# Patient Record
Sex: Female | Born: 2000 | Race: White | Hispanic: No | Marital: Single | Smoking: Former smoker
Health system: Southern US, Community
[De-identification: ages and names within clinical notes are randomized; demographics above are authoritative.]

## PROBLEM LIST (undated history)

## (undated) DIAGNOSIS — F329 Major depressive disorder, single episode, unspecified: Secondary | ICD-10-CM

## (undated) DIAGNOSIS — G43909 Migraine, unspecified, not intractable, without status migrainosus: Secondary | ICD-10-CM

## (undated) HISTORY — DX: Migraine, unspecified, not intractable, without status migrainosus: G43.909

## (undated) HISTORY — DX: Major depressive disorder, single episode, unspecified: F32.9

---
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
## 2018-01-17 ENCOUNTER — Encounter: Payer: Self-pay | Admitting: Family Medicine

## 2018-01-17 ENCOUNTER — Ambulatory Visit: Payer: Managed Care, Other (non HMO) | Admitting: Family Medicine

## 2018-01-17 VITALS — BP 104/64 | HR 92 | Temp 98.3°F | Resp 16 | Ht 64.25 in | Wt 127.4 lb

## 2018-01-17 DIAGNOSIS — F419 Anxiety disorder, unspecified: Secondary | ICD-10-CM

## 2018-01-17 DIAGNOSIS — M41125 Adolescent idiopathic scoliosis, thoracolumbar region: Secondary | ICD-10-CM | POA: Diagnosis not present

## 2018-01-17 DIAGNOSIS — L309 Dermatitis, unspecified: Secondary | ICD-10-CM | POA: Diagnosis not present

## 2018-01-17 DIAGNOSIS — R110 Nausea: Secondary | ICD-10-CM

## 2018-01-17 DIAGNOSIS — H9203 Otalgia, bilateral: Secondary | ICD-10-CM

## 2018-01-17 DIAGNOSIS — F329 Major depressive disorder, single episode, unspecified: Secondary | ICD-10-CM

## 2018-01-17 DIAGNOSIS — F32A Depression, unspecified: Secondary | ICD-10-CM

## 2018-01-17 DIAGNOSIS — Z6981 Encounter for mental health services for victim of other abuse: Secondary | ICD-10-CM | POA: Insufficient documentation

## 2018-01-17 MED ORDER — SERTRALINE HCL 50 MG PO TABS: 50.0000 mg | ORAL_TABLET | Freq: Every day | ORAL | 3 refills | Status: DC

## 2018-01-17 MED ORDER — ONDANSETRON HCL 4 MG PO TABS: 4.0000 mg | ORAL_TABLET | Freq: Three times a day (TID) | ORAL | 0 refills | Status: DC | PRN

## 2018-01-17 MED ORDER — TRIAMCINOLONE ACETONIDE 0.1 % EX CREA: 1.0000 "application " | TOPICAL_CREAM | Freq: Two times a day (BID) | CUTANEOUS | 0 refills | Status: DC

## 2018-01-17 MED ORDER — LORATADINE 10 MG PO TABS: 10.0000 mg | ORAL_TABLET | Freq: Every day | ORAL | 11 refills | Status: DC

## 2018-01-17 MED ORDER — HYDROXYZINE HCL 10 MG PO TABS: 10.0000 mg | ORAL_TABLET | Freq: Three times a day (TID) | ORAL | 3 refills | Status: DC | PRN

## 2018-01-17 NOTE — Progress Notes (Addendum)
Subjective:    Patient ID: Robyn Gonzalez, female    DOB: 2000-08-30, 17 y.o.   MRN: [id number]  HPI  Presents to clinic to establish care as a new patient.   She has a long-standing history of anxiety and depression.  She was being treated as a preteen first with  Prozac, then was switched to Zoloft.  Patient states she became upset that she needed a medication to make her feel better so she stopped taking it.  However, she realizes now that she did feel better when on Zoloft and would like to trial this medication again.  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  Denies suicidal ideation or any thoughts of hurting others.  Also reports eczema flareups.  States she will get patchy rashes on legs and sometimes on the abdomen area.  States she has tried many over-the-counter lotions without much help and soothing skin.  Also has been treated off and on for her scoliosis with physical therapy for many years.  She is interested in trying physical therapy again and also seeing a scoliosis specialist.  States her friend is seeing a physical therapist and really likes them, would like to also see this PT.  She will call and let me know the name of person so we can get her set up.  Also today is complaining of bilateral ear pain and nausea.  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  She has not been vomiting or having diarrhea.  Denies any possible chance of pregnancy.   Patient Active Problem List   Diagnosis Date Noted  . Eczema 01/17/2018  . Anxiety and depression 01/17/2018  . Adolescent idiopathic scoliosis of thoracolumbar region 01/17/2018  . Depression 06/13/2009   Social History   Tobacco Use  . Smoking status: Current Every Day Smoker    Types: E-cigarettes    Start date: 01/18/2015  . Smokeless  tobacco: Never Used  Substance Use Topics  . Alcohol use: Never    Frequency: Never   Review of Systems  Constitutional: Negative for chills, diaphoresis, fatigue and fever.  HENT: Positive for ear pain. Negative for congestion, dental problem, ear discharge, postnasal drip, sinus pressure, sinus pain, sore throat, tinnitus and trouble swallowing.   Eyes: Negative for pain, discharge, redness and itching.  Respiratory: Negative for cough, shortness of breath and wheezing.   Cardiovascular: Negative for chest pain, palpitations and leg swelling.  Gastrointestinal: Positive for nausea. Negative for abdominal distention, abdominal pain, diarrhea and vomiting.  Genitourinary: Negative for difficulty urinating, dysuria and menstrual problem.  Musculoskeletal: Negative for arthralgias and myalgias.  Skin: Positive for rash.       Scattered eczema rash on legs  Neurological: Negative for dizziness, light-headedness and headaches.  Psychiatric/Behavioral: Positive for sleep disturbance. Negative for suicidal ideas. The patient is nervous/anxious.        Increased anxiety, sometimes this causes her issues with being able to fall asleep.       Objective:   Physical Exam  Constitutional: She is oriented to person, place, and time. She appears well-developed and well-nourished. No distress.  HENT:  Head: Normocephalic and atraumatic.  Right Ear: External ear normal.  Left Ear: External ear normal.  Nose: Nose normal.  Mouth/Throat: Oropharynx is clear and moist.  Some middle ear  fluid, TMs not red  Eyes: Pupils are equal, round, and reactive to light. Conjunctivae and EOM are normal. Right eye exhibits no discharge. Left eye exhibits no discharge. No scleral icterus.  Neck: Normal range of motion. Neck supple. No tracheal deviation present.  Cardiovascular: Normal rate, regular rhythm and normal heart sounds.  Pulmonary/Chest: Effort normal and breath sounds normal. No respiratory distress. She  has no wheezes. She has no rales.  Abdominal: Soft. Bowel sounds are normal. She exhibits no distension.  Lymphadenopathy:    She has no cervical adenopathy.  Neurological: She is alert and oriented to person, place, and time. No cranial nerve deficit. Coordination normal.  Gait normal  Skin: Skin is warm and dry. No pallor.  Small faint red rash area on both legs, skin is not dry or flaking.   Psychiatric: She has a normal mood and affect. Her behavior is normal. Judgment and thought content normal.  Nursing note and vitals reviewed.  Vitals:   01/17/18 1103 01/17/18 1140  BP: 104/64   Pulse: (!) 113 92  Resp: 16   Temp: 98.3 F (36.8 C)   SpO2: 97%        Assessment & Plan:   A total of 30  minutes were spent face-to-face with the patient during this encounter and over half of that time was spent on counseling and coordination of care. The patient was counseled on strategies to manage anxiety and depression, medications.    Anxiety and depression -patient will begin Zoloft 50 mg once daily.  She will also use hydroxyzine 10 mg as needed for breakthrough anxiety.  Discussed strategies to help keep a positive outlook.  Advised to look at her medication as a tool that helps her rather than a negative thing.  Offered counseling, but patient would prefer not to do this.  Patient has good support from her family and is looking forward to starting school next month.  Eczema-patient will use topical steroid cream as needed for rash.  If rash symptoms worsen, we discussed option of referral to dermatology.  Ear pain- I feel this is allergy related.  Patient will start daily Claritin to see if this helps.  Scoliosis -  patient will let me know name of physical therapist she would like to see & we will do referral. Also will place referral to orthopedic specialist, patient aware that surgery most likely will be needed on her spine at some point.   Nausea- symptom is very mild at this time.  Zofran to use as needed given.  Advised to increase water intake and be sure to do good handwashing.  She will follow up in 3-4 weeks to see how she is feeling on new medication. She has signed medical release so we can get records from the pediatrician.

## 2018-01-17 NOTE — Patient Instructions (Signed)
Great to meet you!  Keep a positive attitude, you have a lot of great things happening in your life

## 2018-01-18 ENCOUNTER — Telehealth: Payer: Self-pay | Admitting: Family Medicine

## 2018-01-18 NOTE — Telephone Encounter (Signed)
RO[facility] from [facility] on 01/18/2018.

## 2018-01-18 NOTE — Addendum Note (Signed)
Addended by: Leanora CoverGUSE, Gustavus Haskin on: 01/18/2018 07:48 AM   Modules accepted: Orders

## 2018-01-24 ENCOUNTER — Telehealth: Payer: Self-pay | Admitting: Family Medicine

## 2018-01-24 NOTE — Telephone Encounter (Signed)
ROI received from [facility] on 01/24/18.

## 2018-01-31 ENCOUNTER — Ambulatory Visit: Payer: Managed Care, Other (non HMO) | Admitting: Family Medicine

## 2018-01-31 ENCOUNTER — Encounter

## 2018-02-01 ENCOUNTER — Ambulatory Visit: Payer: Managed Care, Other (non HMO) | Admitting: Family Medicine

## 2018-02-01 DIAGNOSIS — Z0289 Encounter for other administrative examinations: Secondary | ICD-10-CM

## 2018-02-07 ENCOUNTER — Other Ambulatory Visit: Payer: Self-pay

## 2018-02-07 ENCOUNTER — Encounter: Payer: Self-pay | Admitting: Family Medicine

## 2018-02-07 ENCOUNTER — Ambulatory Visit: Payer: Managed Care, Other (non HMO) | Admitting: Family Medicine

## 2018-02-07 VITALS — BP 96/68 | HR 94 | Temp 98.2°F | Wt 125.1 lb

## 2018-02-07 DIAGNOSIS — G4700 Insomnia, unspecified: Secondary | ICD-10-CM | POA: Diagnosis not present

## 2018-02-07 DIAGNOSIS — F419 Anxiety disorder, unspecified: Secondary | ICD-10-CM | POA: Diagnosis not present

## 2018-02-07 DIAGNOSIS — F32A Depression, unspecified: Secondary | ICD-10-CM

## 2018-02-07 DIAGNOSIS — R110 Nausea: Secondary | ICD-10-CM

## 2018-02-07 DIAGNOSIS — F329 Major depressive disorder, single episode, unspecified: Secondary | ICD-10-CM | POA: Diagnosis not present

## 2018-02-07 MED ORDER — ONDANSETRON HCL 4 MG PO TABS: 4.0000 mg | ORAL_TABLET | Freq: Three times a day (TID) | ORAL | 0 refills | Status: DC | PRN

## 2018-02-07 NOTE — Progress Notes (Signed)
   Subjective:    Patient ID: Robyn Gonzalez, female    DOB: [birth year], 17 y.o.   MRN: [id number]  HPI   Patient presents to clinic for follow-up on anxiety and depression after starting Zoloft approximately 3 weeks ago.  States her mood feels improved on this medication, does not feel as nervous.  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  Has not needed to use any hydroxyzine for breakthrough anxiety.  Notes she has trouble sleeping at night, has not been able to shut her mind down.  States insomnia has been happening off and on for many years.  Tries to read and wind down before bed but often will end laying in the dark for a while trying to fall asleep.  States she has tried melatonin in the past but this gave her nightmares.  States she usually will take a nap in the afternoon if she feels tired.  Patient Active Problem List   Diagnosis Date Noted  . Eczema 01/17/2018  . Anxiety and depression 01/17/2018  . Adolescent idiopathic scoliosis of thoracolumbar region 01/17/2018  . Depression 06/13/2009   Social History   Tobacco Use  . Smoking status: Current Every Day Smoker    Types: E-cigarettes    Start date: 01/18/2015  . Smokeless tobacco: Never Used  Substance Use Topics  . Alcohol use: Never    Frequency: Never   Review of Systems  Constitutional: Negative for chills, fatigue and fever.  HENT: Negative for congestion, ear pain, sinus pain and sore throat.   Eyes: Negative.   Respiratory: Negative for cough, shortness of breath and wheezing.   Cardiovascular: Negative for chest pain, palpitations and leg swelling.  Gastrointestinal: Negative for abdominal pain, diarrhea, nausea and vomiting.  Genitourinary: Negative for dysuria, frequency and urgency.  Musculoskeletal: Negative for arthralgias and myalgias.  Skin: Negative for color change, pallor and rash.  Neurological: Negative for syncope, light-headedness and headaches.    Psychiatric/Behavioral: The patient is not nervous/anxious.  +trouble sleeping     Objective:   Physical Exam  Constitutional: She is oriented to person, place, and time. She appears well-developed and well-nourished. No distress.  HENT:  Head: Normocephalic and atraumatic.  Eyes: No scleral icterus.  Cardiovascular: Normal rate and regular rhythm.  Pulmonary/Chest: Effort normal and breath sounds normal. No respiratory distress.  Neurological: She is alert and oriented to person, place, and time.  Gait normal  Skin: Skin is warm and dry. No pallor.  Psychiatric: She has a normal mood and affect. Her behavior is normal. Thought content normal.  No SI  Vitals reviewed.   Vitals:   02/07/18 0803  BP: 96/68  Pulse: 94  Temp: 98.2 F (36.8 C)  SpO2: 96%      Assessment & Plan:    Anxiety and depression - patient has been doing well so far on Zoloft 50 mg, she will continue this dose.   Insomnia - good sleep hygiene discussed, patient given handout about insomnia and ways to treat this at home.  Also advised she can try taking the hydroxyzine to see if this can help her mind calm down at night.   Follow-up in 3 months for recheck.  He may return to clinic sooner as needed if any issues arise.

## 2018-02-07 NOTE — Patient Instructions (Signed)
Great to see you again!  Insomnia Insomnia is a sleep disorder that makes it difficult to fall asleep or to stay asleep. Insomnia can cause tiredness (fatigue), low energy, difficulty concentrating, mood swings, and poor performance at work or school. There are three different ways to classify insomnia:  Difficulty falling asleep.  Difficulty staying asleep.  Waking up too early in the morning.  Any type of insomnia can be long-term (chronic) or short-term (acute). Both are common. Short-term insomnia usually lasts for three months or less. Chronic insomnia occurs at least three times a week for longer than three months. What are the causes? Insomnia may be caused by another condition, situation, or substance, such as:  Anxiety.  Certain medicines.  Gastroesophageal reflux disease (GERD) or other gastrointestinal conditions.  Asthma or other breathing conditions.  Restless legs syndrome, sleep apnea, or other sleep disorders.  Chronic pain.  Menopause. This may include hot flashes.  Stroke.  Abuse of alcohol, tobacco, or illegal drugs.  Depression.  Caffeine.  Neurological disorders, such as Alzheimer disease.  An overactive thyroid (hyperthyroidism).  The cause of insomnia may not be known. What increases the risk? Risk factors for insomnia include:  Gender. Women are more commonly affected than men.  Age. Insomnia is more common as you get older.  Stress. This may involve your professional or personal life.  Income. Insomnia is more common in people with lower income.  Lack of exercise.  Irregular work schedule or night shifts.  Traveling between different time zones.  What are the signs or symptoms? If you have insomnia, trouble falling asleep or trouble staying asleep is the main symptom. This may lead to other symptoms, such as:  Feeling fatigued.  Feeling nervous about going to sleep.  Not feeling rested in the morning.  Having trouble  concentrating.  Feeling irritable, anxious, or depressed.  How is this treated? Treatment for insomnia depends on the cause. If your insomnia is caused by an underlying condition, treatment will focus on addressing the condition. Treatment may also include:  Medicines to help you sleep.  Counseling or therapy.  Lifestyle adjustments.  Follow these instructions at home:  Take medicines only as directed by your health care provider.  Keep regular sleeping and waking hours. Avoid naps.  Keep a sleep diary to help you and your health care provider figure out what could be causing your insomnia. Include: ? When you sleep. ? When you wake up during the night. ? How well you sleep. ? How rested you feel the next day. ? Any side effects of medicines you are taking. ? What you eat and drink.  Make your bedroom a comfortable place where it is easy to fall asleep: ? Put up shades or special blackout curtains to block light from outside. ? Use a white noise machine to block noise. ? Keep the temperature cool.  Exercise regularly as directed by your health care provider. Avoid exercising right before bedtime.  Use relaxation techniques to manage stress. Ask your health care provider to suggest some techniques that may work well for you. These may include: ? Breathing exercises. ? Routines to release muscle tension. ? Visualizing peaceful scenes.  Cut back on alcohol, caffeinated beverages, and cigarettes, especially close to bedtime. These can disrupt your sleep.  Do not overeat or eat spicy foods right before bedtime. This can lead to digestive discomfort that can make it hard for you to sleep.  Limit screen use before bedtime. This includes: ? Watching TV. ?  Using your smartphone, tablet, and computer.  Stick to a routine. This can help you fall asleep faster. Try to do a quiet activity, brush your teeth, and go to bed at the same time each night.  Get out of bed if you are  still awake after 15 minutes of trying to sleep. Keep the lights down, but try reading or doing a quiet activity. When you feel sleepy, go back to bed.  Make sure that you drive carefully. Avoid driving if you feel very sleepy.  Keep all follow-up appointments as directed by your health care provider. This is important. Contact a health care provider if:  You are tired throughout the day or have trouble in your daily routine due to sleepiness.  You continue to have sleep problems or your sleep problems get worse. Get help right away if:  You have serious thoughts about hurting yourself or someone else. This information is not intended to replace advice given to you by your health care provider. Make sure you discuss any questions you have with your health care provider. Document Released: 05/27/2000 Document Revised: 10/30/2015 Document Reviewed: 02/28/2014 Elsevier Interactive Patient Education  [employer].

## 2018-02-17 IMAGING — CR DG SCOLIOSIS EVAL COMPLETE SPINE 1V
2 series · 2 of 2 positions shown · non-contrast
Comparison: 05/13/2016 and 03/23/2015 spinal series.

CLINICAL DATA: 15-year-old female with 3 years of lumbar back pain.
No known injury. Scoliosis. Initial encounter.

EXAM:
DG SCOLIOSIS EVAL COMPLETE SPINE 1V

[tl-spine ap (1 of 2)]
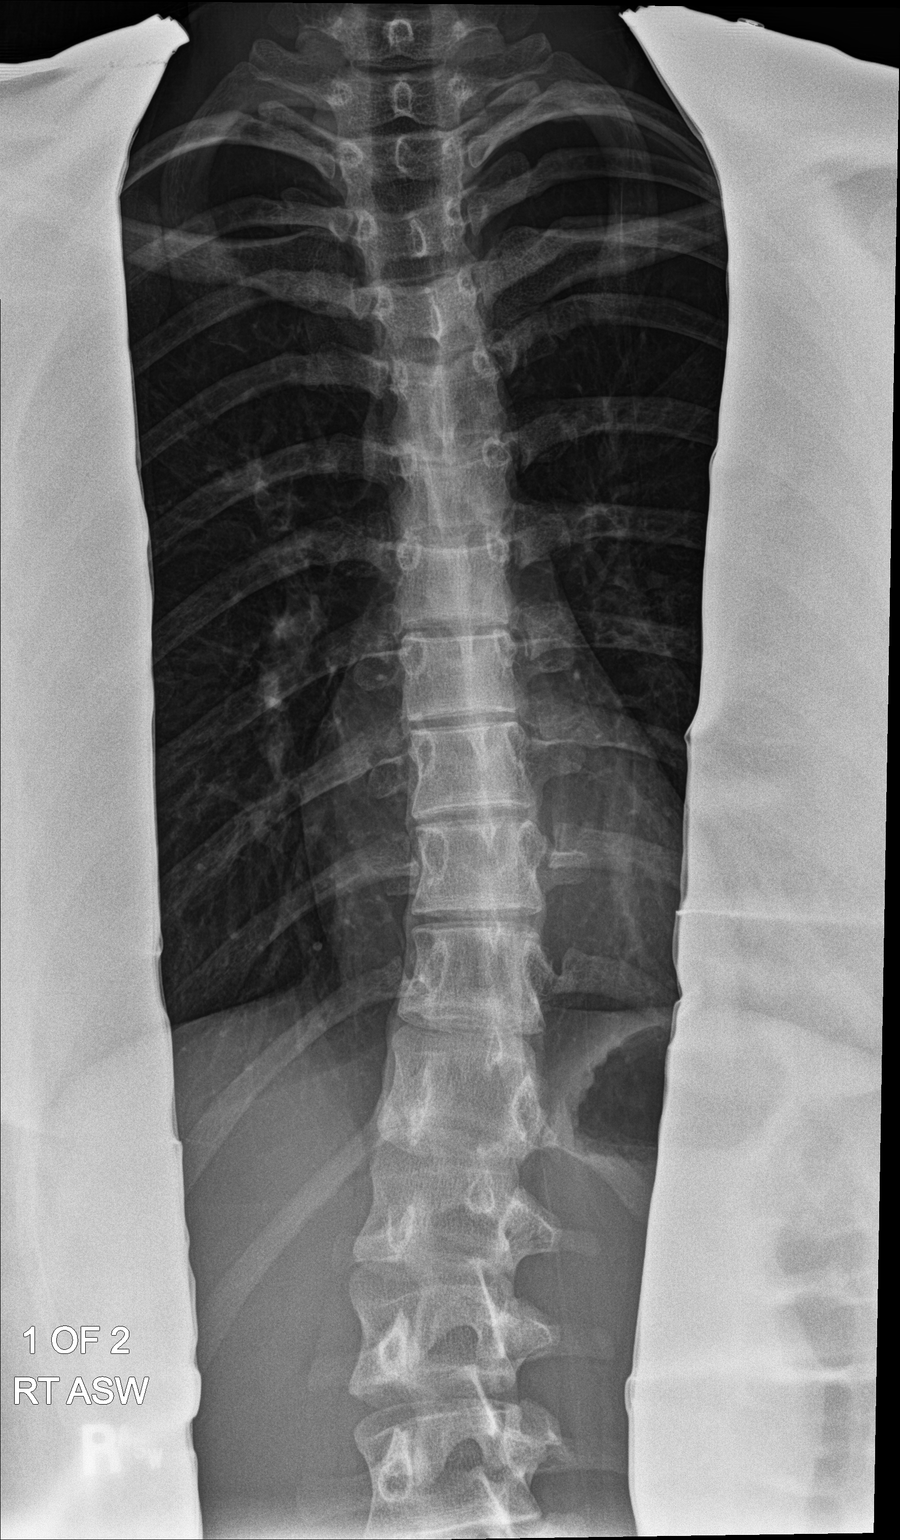

[tl-spine ap (2 of 2)]
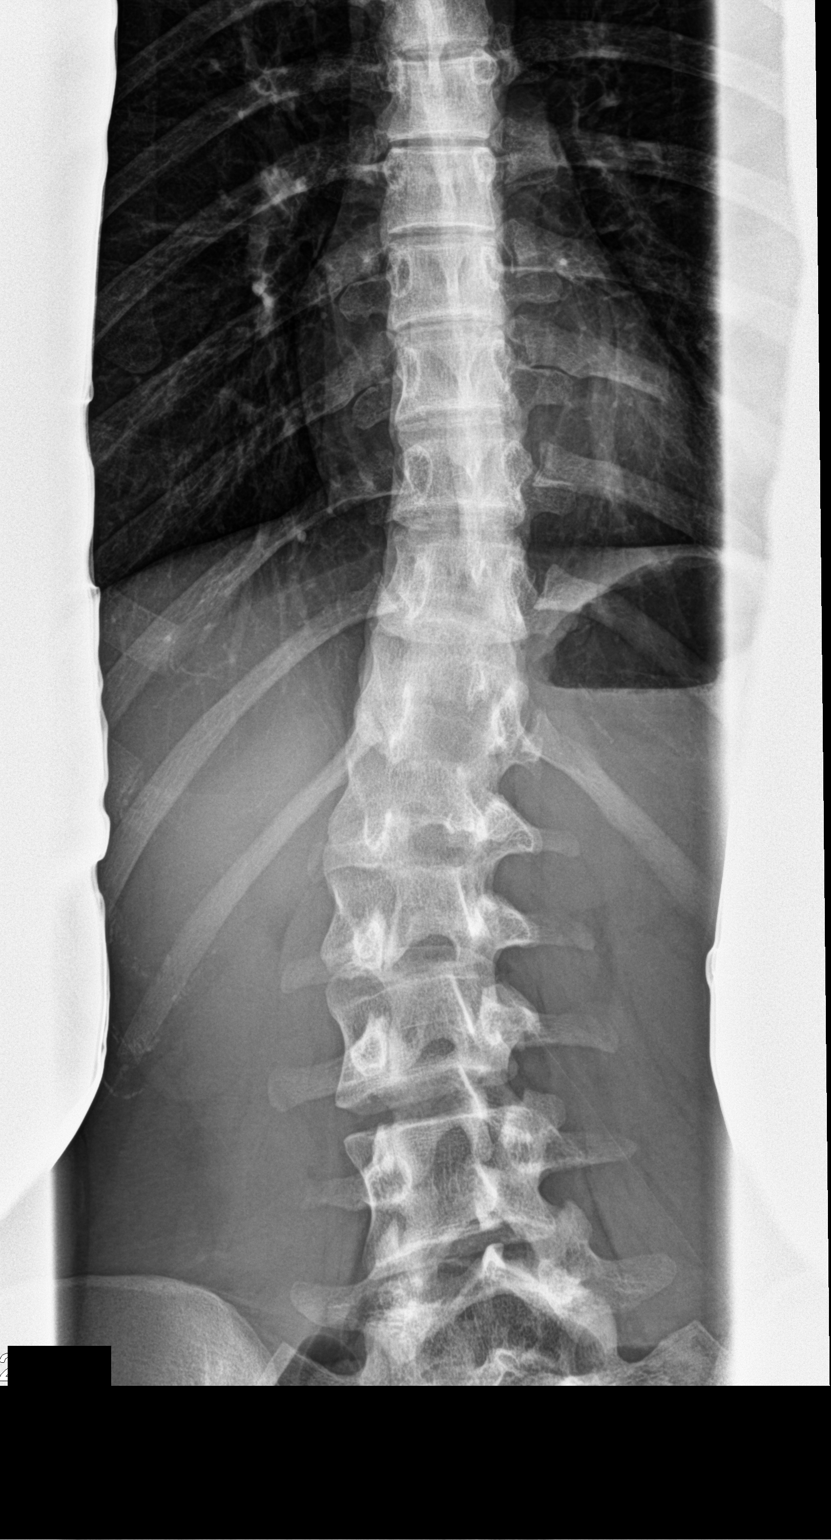

[2 of 2 positions shown; findings below may reference images not displayed]

FINDINGS: Normal thoracic and lumbar segmentation. Bone mineralization is
within normal limits. Negative visualized chest and abdominal
visceral contours.

Mild levoconvex thoracic scoliosis with apex at T5 appears stable
since 2158 measuring about 6 degrees from T3-T4 to T7-T8.

Superimposed dextroconvex lumbar scoliosis with a rotatory component
and apex at L2-L3 appears progressed since 2158, now estimated at 19
or 20 degrees from T11-T12 to L4-L5 (previously 16 degrees).
IMPRESSION: 1. Dextroconvex lumbar scoliosis with apex at L2-L3 appears
increased since 2158, now up to 20 degrees (previously 16 degrees).
2. Minor levoconvex thoracic scoliosis appears stable.

## 2018-02-21 ENCOUNTER — Encounter: Payer: Self-pay | Admitting: Family Medicine

## 2018-02-26 ENCOUNTER — Encounter: Payer: Self-pay | Admitting: Family Medicine

## 2018-02-26 ENCOUNTER — Ambulatory Visit: Payer: Managed Care, Other (non HMO) | Admitting: Family Medicine

## 2018-02-26 VITALS — BP 102/68 | HR 96 | Temp 98.6°F | Ht 64.25 in | Wt 125.6 lb

## 2018-02-26 DIAGNOSIS — H6691 Otitis media, unspecified, right ear: Secondary | ICD-10-CM

## 2018-02-26 MED ORDER — AMOXICILLIN-POT CLAVULANATE 875-125 MG PO TABS: 1.0000 | ORAL_TABLET | Freq: Two times a day (BID) | ORAL | 0 refills | Status: DC

## 2018-02-26 NOTE — Progress Notes (Signed)
   Subjective:    Patient ID: Robyn Gonzalez, female    DOB: July 21, 2000, 17 y.o.   MRN: [id number]  HPI   Patient presents to clinic c/o fever, congestion, ear pain, scratchy throat for past week. States symptoms seem to be getting worse rather than better.  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   Patient Active Problem List   Diagnosis Date Noted  . Eczema 01/17/2018  . Anxiety and depression 01/17/2018  . Adolescent idiopathic scoliosis of thoracolumbar region 01/17/2018  . Depression 06/13/2009   Social History   Tobacco Use  . Smoking status: Current Every Day Smoker    Types: E-cigarettes    Start date: 01/18/2015  . Smokeless tobacco: Never Used  Substance Use Topics  . Alcohol use: Never    Frequency: Never   Review of Systems  Constitutional: Negative for chills, fatigue and fever.  HENT:Positive for congestion, ear pain, sinus pain and sore throat.   Eyes: Negative.   Respiratory: Negative for cough, shortness of breath and wheezing.   Cardiovascular: Negative for chest pain, palpitations and leg swelling.  Gastrointestinal: Negative for abdominal pain, diarrhea, nausea and vomiting.  Genitourinary: Negative for dysuria, frequency and urgency.  Musculoskeletal: Negative for arthralgias and myalgias.  Skin: Negative for color change, pallor and rash.  Neurological: Negative for syncope, light-headedness and headaches.  Psychiatric/Behavioral: The patient is not nervous/anxious.       Objective:   Physical Exam  Constitutional: She is oriented to person, place, and time. She appears well-developed and well-nourished. No distress.  HENT:  Head: Normocephalic and atraumatic.  +bright red and bulging Right TM. Left TM fullness, but not red +post nasal drip  Eyes: Right eye exhibits no discharge. Left eye exhibits no discharge. No scleral icterus.  Neck: Neck supple. No tracheal deviation  present.  Cardiovascular: Normal rate and regular rhythm.  Musculoskeletal: She exhibits no edema.  Neurological: She is alert and oriented to person, place, and time.  Skin: Skin is warm and dry. No pallor.  Psychiatric: She has a normal mood and affect. Her behavior is normal.  Nursing note and vitals reviewed.   Vitals:   02/26/18 1038 02/26/18 1045  BP: 102/68   Pulse: (!) 119 96  Temp: 98.6 F (37 C)   SpO2: 94%       Assessment & Plan:   Acute right otitis media -- Augmentin BID for 10 days. Continue Claritin. Increase fluids, rest, do good handwashing.   Keep regular follow up as already planned. RTC sooner if issues arise.

## 2018-04-20 ENCOUNTER — Ambulatory Visit (INDEPENDENT_AMBULATORY_CARE_PROVIDER_SITE_OTHER): Payer: Managed Care, Other (non HMO) | Admitting: *Deleted

## 2018-04-20 DIAGNOSIS — Z23 Encounter for immunization: Secondary | ICD-10-CM

## 2018-04-23 ENCOUNTER — Ambulatory Visit (INDEPENDENT_AMBULATORY_CARE_PROVIDER_SITE_OTHER): Payer: Managed Care, Other (non HMO) | Admitting: Family Medicine

## 2018-04-23 ENCOUNTER — Encounter: Payer: Self-pay | Admitting: Family Medicine

## 2018-04-23 VITALS — BP 90/56 | HR 100 | Temp 99.0°F | Ht 64.0 in | Wt 131.4 lb

## 2018-04-23 DIAGNOSIS — R5382 Chronic fatigue, unspecified: Secondary | ICD-10-CM

## 2018-04-23 DIAGNOSIS — Z840 Family history of diseases of the skin and subcutaneous tissue: Secondary | ICD-10-CM | POA: Insufficient documentation

## 2018-04-23 DIAGNOSIS — E559 Vitamin D deficiency, unspecified: Secondary | ICD-10-CM

## 2018-04-23 DIAGNOSIS — F419 Anxiety disorder, unspecified: Secondary | ICD-10-CM | POA: Diagnosis not present

## 2018-04-23 DIAGNOSIS — F329 Major depressive disorder, single episode, unspecified: Secondary | ICD-10-CM

## 2018-04-23 DIAGNOSIS — J029 Acute pharyngitis, unspecified: Secondary | ICD-10-CM

## 2018-04-23 DIAGNOSIS — R0982 Postnasal drip: Secondary | ICD-10-CM | POA: Diagnosis not present

## 2018-04-23 DIAGNOSIS — E538 Deficiency of other specified B group vitamins: Secondary | ICD-10-CM

## 2018-04-23 DIAGNOSIS — IMO0002 Reserved for concepts with insufficient information to code with codable children: Secondary | ICD-10-CM | POA: Insufficient documentation

## 2018-04-23 LAB — CBC WITH DIFFERENTIAL/PLATELET
BASOS PCT: 0.3 % (ref 0.0–3.0)
Basophils Absolute: 0 10*3/uL (ref 0.0–0.1)
EOS PCT: 0.7 % (ref 0.0–5.0)
Eosinophils Absolute: 0.1 10*3/uL (ref 0.0–0.7)
HCT: 38.8 % (ref 36.0–49.0)
HEMOGLOBIN: 12.9 g/dL (ref 12.0–16.0)
Lymphocytes Relative: 12.6 % — ABNORMAL LOW (ref 24.0–48.0)
Lymphs Abs: 1.6 10*3/uL (ref 0.7–4.0)
MCHC: 33.2 g/dL (ref 31.0–37.0)
MCV: 91.8 fl (ref 78.0–98.0)
MONOS PCT: 6.4 % (ref 3.0–12.0)
Monocytes Absolute: 0.8 10*3/uL (ref 0.1–1.0)
Neutro Abs: 10.3 10*3/uL — ABNORMAL HIGH (ref 1.4–7.7)
Neutrophils Relative %: 80 % — ABNORMAL HIGH (ref 43.0–71.0)
Platelets: 279 10*3/uL (ref 150.0–575.0)
RBC: 4.23 Mil/uL (ref 3.80–5.70)
RDW: 13.4 % (ref 11.4–15.5)
WBC: 12.9 10*3/uL (ref 4.5–13.5)

## 2018-04-23 LAB — B12 AND FOLATE PANEL
Folate: 11.7 ng/mL
Vitamin B-12: 148 pg/mL — ABNORMAL LOW (ref 211–911)

## 2018-04-23 LAB — COMPREHENSIVE METABOLIC PANEL
ALBUMIN: 4.4 g/dL (ref 3.5–5.2)
ALK PHOS: 55 U/L (ref 47–119)
ALT: 11 U/L (ref 0–35)
AST: 13 U/L (ref 0–37)
BUN: 13 mg/dL (ref 6–23)
CO2: 27 mEq/L (ref 19–32)
Calcium: 9.5 mg/dL (ref 8.4–10.5)
Chloride: 105 mEq/L (ref 96–112)
Creatinine, Ser: 0.76 mg/dL (ref 0.40–1.20)
GFR: 104.33 mL/min (ref >60.00)
Glucose, Bld: 117 mg/dL — ABNORMAL HIGH (ref 70–99)
POTASSIUM: 3.7 meq/L (ref 3.5–5.1)
Sodium: 140 mEq/L (ref 135–145)
TOTAL PROTEIN: 6.9 g/dL (ref 6.0–8.3)
Total Bilirubin: 0.7 mg/dL (ref 0.3–1.2)

## 2018-04-23 LAB — POCT RAPID STREP A (OFFICE): RAPID STREP A SCREEN: NEGATIVE

## 2018-04-23 LAB — C-REACTIVE PROTEIN: CRP: 1.7 mg/dL (ref 0.5–20.0)

## 2018-04-23 LAB — VITAMIN D 25 HYDROXY (VIT D DEFICIENCY, FRACTURES): VITD: 29.24 ng/mL — ABNORMAL LOW (ref 30.00–100.00)

## 2018-04-23 LAB — SEDIMENTATION RATE: Sed Rate: 11 mm/hr (ref 0–20)

## 2018-04-23 MED ORDER — FLUTICASONE PROPIONATE 50 MCG/ACT NA SUSP: 2.0000 | Freq: Every day | NASAL | 6 refills | Status: DC

## 2018-04-23 NOTE — Progress Notes (Signed)
Subjective:    Patient ID: Robyn Gonzalez, female    DOB: 11/28/2000, 17 y.o.   MRN: [id number]  HPI  Presents to clinic to follow up in anxiety and depression while on zoloft 50mg .  Patient states she stopped taking Zoloft, states she has a hard time remembering to take pills every day.  Feels that her at this time her anxiety and depression are under good control, has good support from family and friends.  Does have hydroxyzine to use as needed, has not had to use in the past few weeks.  Also c/o sore throat, congestion, feeling run down for past week. She would like to be tested for strep throat.  Patient's mom is also concerned, patient is often sick with respiratory, ear, throat type illnesses & often fatigued.  Mom states that patient's aunt has lupus, and often was sick in a similar way to her daughter prior to being diagnosed.  Patient Active Problem List   Diagnosis Date Noted  . Eczema 01/17/2018  . Anxiety and depression 01/17/2018  . Adolescent idiopathic scoliosis of thoracolumbar region 01/17/2018  . Depression 06/13/2009   Social History   Tobacco Use  . Smoking status: Current Every Day Smoker    Types: E-cigarettes    Start date: 01/18/2015  . Smokeless tobacco: Never Used  Substance Use Topics  . Alcohol use: Never    Frequency: Never   Review of Systems   Constitutional: Negative for chills, fatigue and fever.  HENT: +nasal congestion and sore throat.  Eyes: Negative.   Respiratory: Negative for cough, shortness of breath and wheezing.   Cardiovascular: Negative for chest pain, palpitations and leg swelling.  Gastrointestinal: Negative for abdominal pain, diarrhea, nausea and vomiting.  Genitourinary: Negative for dysuria, frequency and urgency.  Musculoskeletal: Negative for arthralgias and myalgias.  Skin: Negative for color change, pallor and rash.  Neurological: Negative for syncope, light-headedness and headaches.  Psychiatric/Behavioral: The  patient is not nervous/anxious.       Objective:   Physical Exam  Constitutional: She is oriented to person, place, and time. She appears well-nourished. No distress.  HENT:  Head: Normocephalic and atraumatic.  Right Ear: Tympanic membrane, external ear and ear canal normal.  Left Ear: Tympanic membrane, external ear and ear canal normal.  Nose: Rhinorrhea present.  +cobblestoning pattern on back of throat consistent with post nasal drip.   Eyes: Conjunctivae and EOM are normal. No scleral icterus.  Neck: Normal range of motion. Neck supple. No JVD present. No tracheal deviation present.  Cardiovascular: Normal rate, regular rhythm and normal heart sounds.  Pulmonary/Chest: Effort normal and breath sounds normal. No respiratory distress.  Musculoskeletal: She exhibits no edema.  Gait normal.   Lymphadenopathy:    She has no cervical adenopathy.  Neurological: She is alert and oriented to person, place, and time.  Skin: Skin is warm and dry. No pallor.  Psychiatric: She has a normal mood and affect. Her behavior is normal.  Nursing note and vitals reviewed.  Vitals:   04/23/18 1116  BP: (!) 90/56  Pulse: 100  Temp: 99 F (37.2 C)  SpO2: 98%      Assessment & Plan:   Chronic fatigue, family history of lupus-we will do lab work today including CBC, CMP, thyroid panel, vitamin D, B12, ANA, RF, CRP and sed rate to further investigate.  Anxiety and depression - patient's mood is stable at this time.  She no longer is taking Zoloft.  She will use hydroxyzine if  needed for any anxiety breakthrough.  Postnasal drip/sore throat - rapid strep is negative in clinic.  Suspect soreness in throat was related to postnasal drip.  Patient advised to begin Claritin 10 mg once daily and use a nasal spray to help control congestion/postnasal drip.  Throat culture was also collected and sent to lab.  Patient and mom aware they will be connected with lab results when available.  Follow-up in 3  months for management of chronic conditions.  Patient aware she can return to clinic sooner if any issues arise.

## 2018-04-23 NOTE — Patient Instructions (Signed)
Take claritin 10 mg daily and use nasal spray to help nasal congestion and sore throat

## 2018-04-24 LAB — THYROID PANEL WITH TSH
FREE THYROXINE INDEX: 2.6 (ref 1.4–3.8)
T3 UPTAKE: 30 % (ref 22–35)
T4 TOTAL: 8.5 ug/dL (ref 5.3–11.7)
TSH: 0.56 mIU/L

## 2018-04-24 LAB — RHEUMATOID FACTOR

## 2018-04-24 LAB — ANA: Anti Nuclear Antibody(ANA): NEGATIVE

## 2018-04-25 ENCOUNTER — Telehealth: Payer: Self-pay | Admitting: Family Medicine

## 2018-04-25 ENCOUNTER — Encounter: Payer: Self-pay | Admitting: Physical Therapy

## 2018-04-25 MED ORDER — VITAMIN D 25 MCG (1000 UNIT) PO TABS: 1000.0000 [IU] | ORAL_TABLET | Freq: Every day | ORAL | 1 refills | Status: DC

## 2018-04-25 MED ORDER — VITAMIN B-12 1000 MCG PO TABS: 1000.0000 ug | ORAL_TABLET | Freq: Every day | ORAL | 1 refills | Status: DC

## 2018-04-25 NOTE — Telephone Encounter (Signed)
I have called patient & reviewed lab results.

## 2018-04-25 NOTE — Telephone Encounter (Signed)
Copied from CRM [phone]. Topic: Quick Communication - See Telephone Encounter >> Apr 25, 2018  9:13 AM Arlyss Gandyichardson, Maikel Neisler N, NT wrote: CRM for notification. See Telephone encounter for: 04/25/18. Pt would like a call to go over her lab results.

## 2018-04-25 NOTE — Addendum Note (Signed)
Addended by: Leanora CoverGUSE, Eldoris Beiser on: 04/25/2018 07:13 AM   Modules accepted: Orders

## 2018-04-26 LAB — CULTURE, UPPER RESPIRATORY
MICRO NUMBER:: 91355119
SPECIMEN QUALITY: ADEQUATE

## 2018-05-11 ENCOUNTER — Ambulatory Visit: Payer: Managed Care, Other (non HMO) | Admitting: Family Medicine

## 2018-05-23 ENCOUNTER — Encounter: Payer: Self-pay | Admitting: Family

## 2018-05-23 ENCOUNTER — Ambulatory Visit (INDEPENDENT_AMBULATORY_CARE_PROVIDER_SITE_OTHER): Payer: Managed Care, Other (non HMO) | Admitting: Family

## 2018-05-23 VITALS — BP 92/58 | HR 97 | Temp 98.1°F | Wt 127.8 lb

## 2018-05-23 DIAGNOSIS — R21 Rash and other nonspecific skin eruption: Secondary | ICD-10-CM | POA: Diagnosis not present

## 2018-05-23 MED ORDER — MUPIROCIN 2 % EX OINT: 1.0000 "application " | TOPICAL_OINTMENT | Freq: Two times a day (BID) | CUTANEOUS | 0 refills | Status: DC

## 2018-05-23 NOTE — Patient Instructions (Signed)
Start Bactroban ointment on lesions.  May also use topical antihistamine to control itching.  Please let me know if no improvement, or if the redness expands, you start to see streaks, or start to have fever or other new symptoms.

## 2018-05-23 NOTE — Progress Notes (Signed)
Subjective:    Patient ID: Robyn Gonzalez, female    DOB: Jun 19, 2000, 17 y.o.   MRN: [id number]  CC: Robyn Gonzalez is a 17 y.o. female who presents today for an acute visit.    HPI: CC: 2 red lesions right forearm and back of neck x one day, redness has improved.   Tender, itchy. Hasnt tried any medication.  No purulent discharge or lesions.  No fever, chills, N, V, syncope.  Boyfriend has similar lesion on left leg.   No h/o mrsa or skin infections.   On atarax      HISTORY:  Past Medical History:  Diagnosis Date  . Depression 2011  . Migraines 2011   History reviewed. No pertinent surgical history. Family History  Problem Relation Age of Onset  . Diabetes type II Father   . Diabetes Mellitus II Maternal Grandmother   . Hypertension Paternal Grandmother   . Hypertension Paternal Grandfather     Allergies: Patient has no known allergies. Current Outpatient Medications on File Prior to Visit  Medication Sig Dispense Refill  . cholecalciferol (VITAMIN D3) 25 MCG (1000 UT) tablet Take 1 tablet (1,000 Units total) by mouth daily. 90 tablet 1  . etonogestrel (NEXPLANON) 68 MG IMPL implant 1 each by Subdermal route once.    . fluticasone (FLONASE) 50 MCG/ACT nasal spray Place 2 sprays into both nostrils daily. 16 g 6  . hydrOXYzine (ATARAX/VISTARIL) 10 MG tablet Take 1 tablet (10 mg total) by mouth 3 (three) times daily as needed. 30 tablet 3  . loratadine (CLARITIN) 10 MG tablet Take 1 tablet (10 mg total) by mouth daily. 30 tablet 11  . sertraline (ZOLOFT) 50 MG tablet Take 1 tablet (50 mg total) by mouth daily. 30 tablet 3  . triamcinolone cream (KENALOG) 0.1 % Apply 1 application topically 2 (two) times daily. 80 g 0  . vitamin B-12 (CYANOCOBALAMIN) 1000 MCG tablet Take 1 tablet (1,000 mcg total) by mouth daily. 90 tablet 1   No current facility-administered medications on file prior to visit.     Social History   Tobacco Use  . Smoking status:  Current Every Day Smoker    Types: E-cigarettes    Start date: 01/18/2015  . Smokeless tobacco: Never Used  Substance Use Topics  . Alcohol use: Never    Frequency: Never  . Drug use: Never    Review of Systems  Constitutional: Negative for chills and fever.  Respiratory: Negative for cough.   Cardiovascular: Negative for chest pain and palpitations.  Gastrointestinal: Negative for nausea and vomiting.  Skin: Positive for rash.      Objective:    BP (!) 92/58 (BP Location: Left Arm, Patient Position: Sitting, Cuff Size: Normal)   Pulse 97   Temp 98.1 F (36.7 C)   Wt 127 lb 12.8 oz (58 kg)   SpO2 97%   BMI 21.94 kg/m    Physical Exam  Constitutional: She appears well-developed and well-nourished.  Eyes: Conjunctivae are normal.  Cardiovascular: Normal rate, regular rhythm, normal heart sounds and normal pulses.  Pulmonary/Chest: Effort normal and breath sounds normal. She has no wheezes. She has no rhonchi. She has no rales.  Neurological: She is alert.  Skin: Skin is warm and dry.     2-3cm ( approx) localized  erythematous lesions noted left forearm as noted on diagram, posterior scalp.  No purulent discharge, increased warmth.  No streaking.  Skin intact.  Nonfluctuant  Psychiatric: She has a normal mood  and affect. Her speech is normal and behavior is normal. Thought content normal.  Vitals reviewed.      Assessment & Plan:   1. Rash Appearance of localized histamine reaction.  Improved today.  We will also cover empirically with topical Bactroban.  Advised topical antihistamine.  Patient is currently on Atarax.  She will let me know if no improvement  - mupirocin ointment (BACTROBAN) 2 %; Place 1 application into the nose 2 (two) times daily.  Dispense: 22 g; Refill: 0    I have discontinued Aislinn K. Sagar's amoxicillin-clavulanate. I am also having her start on mupirocin ointment. Additionally, I am having her maintain her sertraline, hydrOXYzine,  triamcinolone cream, loratadine, etonogestrel, fluticasone, cholecalciferol, and vitamin B-12.   Meds ordered this encounter  Medications  . mupirocin ointment (BACTROBAN) 2 %    Sig: Place 1 application into the nose 2 (two) times daily.    Dispense:  22 g    Refill:  0    Order Specific Question:   Supervising Provider    Answer:   Sherlene ShamsULLO, TERESA L [[id number]]    Return precautions given.   Risks, benefits, and alternatives of the medications and treatment plan prescribed today were discussed, and patient expressed understanding.   Education regarding symptom management and diagnosis given to patient on AVS.  Continue to follow with Guse, Janna ArchLauren M, FNP for routine health maintenance.   Robyn Kay [place] and I agreed with plan.   Rennie PlowmanMargaret , FNP

## 2018-06-19 ENCOUNTER — Ambulatory Visit (INDEPENDENT_AMBULATORY_CARE_PROVIDER_SITE_OTHER): Payer: Managed Care, Other (non HMO) | Admitting: Family Medicine

## 2018-06-19 ENCOUNTER — Encounter: Payer: Self-pay | Admitting: Family Medicine

## 2018-06-19 VITALS — BP 96/66 | HR 86 | Temp 98.4°F | Ht 64.0 in | Wt 131.0 lb

## 2018-06-19 DIAGNOSIS — Z789 Other specified health status: Secondary | ICD-10-CM

## 2018-06-19 DIAGNOSIS — Z111 Encounter for screening for respiratory tuberculosis: Secondary | ICD-10-CM | POA: Diagnosis not present

## 2018-06-19 DIAGNOSIS — Z0000 Encounter for general adult medical examination without abnormal findings: Secondary | ICD-10-CM

## 2018-06-19 MED ORDER — TUBERCULIN PPD 5 UNIT/0.1ML ID SOLN: 5.0000 [IU] | Freq: Once | INTRADERMAL | Status: DC

## 2018-06-19 NOTE — Progress Notes (Signed)
Subjective:    Patient ID: Robyn Gonzalez, female    DOB: [birth year], 18 y.o.   MRN: [id number]  HPI   Patient presents to clinic for annual wellness exam and also requires a TB skin test and form filled out for her new job.  Overall patient is feeling well and has no complaints today.  She tries to follow a healthy diet and get regular exercise.  Vaccines for patient age are up-to-date.  Patient declines Chlamydia screening at this time.  States she will address this in the future if needed, has no concerns for chlamydia at this time.  Patient sees a dentist regularly, goes twice per year.  Patient usually sees eye doctor once every 2 years.  Currently does not use any contacts or glasses.  Patient does use an e-cigarette, is working on using it less.  Patient Active Problem List   Diagnosis Date Noted  . FH: lupus 04/23/2018  . Chronic fatigue 04/23/2018  . Post-nasal drip 04/23/2018  . Eczema 01/17/2018  . Anxiety and depression 01/17/2018  . Adolescent idiopathic scoliosis of thoracolumbar region 01/17/2018  . Depression 06/13/2009   Social History   Tobacco Use  . Smoking status: Current Every Day Smoker    Types: E-cigarettes    Start date: 01/18/2015  . Smokeless tobacco: Never Used  Substance Use Topics  . Alcohol use: Never    Frequency: Never   Social History   Tobacco Use  . Smoking status: Current Every Day Smoker    Types: E-cigarettes    Start date: 01/18/2015  . Smokeless tobacco: Never Used  Substance Use Topics  . Alcohol use: Never    Frequency: Never   History reviewed. No pertinent surgical history.  Family History  Problem Relation Age of Onset  . Diabetes type II Father   . Diabetes Mellitus II Maternal Grandmother   . Hypertension Paternal Grandmother   . Hypertension Paternal Grandfather     Review of Systems  Constitutional: Negative for chills, fatigue and fever.  HENT: Negative for congestion, ear pain, sinus pain and sore  throat.   Eyes: Negative.   Respiratory: Negative for cough, shortness of breath and wheezing.   Cardiovascular: Negative for chest pain, palpitations and leg swelling.  Gastrointestinal: Negative for abdominal pain, diarrhea, nausea and vomiting.  Genitourinary: Negative for dysuria, frequency and urgency.  Musculoskeletal: Negative for arthralgias and myalgias.  Skin: Negative for color change, pallor and rash.  Neurological: Negative for syncope, light-headedness and headaches.  Psychiatric/Behavioral: The patient is not nervous/anxious.       Objective:   Physical Exam   Constitutional: She appears well-developed and well-nourished. No distress.  HENT:  Head: Normocephalic and atraumatic.  Eyes: Pupils are equal, round, and reactive to light. EOM are normal. No scleral icterus.  Neck: Normal range of motion. Neck supple. No tracheal deviation present.  Cardiovascular: Normal rate, regular rhythm and normal heart sounds.  Pulmonary/Chest: Effort normal and breath sounds normal. No respiratory distress. She has no wheezes. She has no rales.  Abdominal: Soft. Bowel sounds are normal. There is no tenderness.  Neurological: She is alert and oriented to person, place, and time.  Gait normal  Skin: Skin is warm and dry. No pallor.  Psychiatric: She has a normal mood and affect. Her behavior is normal. Thought content normal.   Nursing note and vitals reviewed.    Vitals:   06/19/18 1332  BP: 96/66  Pulse: 86  Temp: 98.4 F (36.9 C)  SpO2:  99%   Assessment & Plan:   Well adult exam/electronic cigarette use- patient is a overall healthy 18 year old female.  She did have lab work back in August 2019.  Patient tries to follow healthy diet and regular exercise.  She sees dentist and eye doctor regularly.  She does use an e-cigarette, but is working on cutting back on this.  She does not drink alcohol or use drugs.  Currently not sexually active, but does plan to use condoms if becomes  sexually active.  Tuberculosis screening - PPD placed, patient is aware she has to return in 2 days for read.  Patient has upcoming appointment in February 2020 for follow-up on mood, she will plan to keep this.  Patient otherwise is aware she can return to clinic at any time if issues arise.

## 2018-06-19 NOTE — Patient Instructions (Signed)
Return in 2 days for read of TB test in forearm

## 2018-06-21 ENCOUNTER — Ambulatory Visit: Payer: Managed Care, Other (non HMO)

## 2018-06-21 DIAGNOSIS — Z111 Encounter for screening for respiratory tuberculosis: Secondary | ICD-10-CM

## 2018-06-21 LAB — READ PPD: TB Skin Test: NEGATIVE

## 2018-06-21 NOTE — Progress Notes (Signed)
Pt was seen today for PPD read. Pt was NEGATIVE. Pt's forms given to patient as well.

## 2018-07-09 ENCOUNTER — Ambulatory Visit (INDEPENDENT_AMBULATORY_CARE_PROVIDER_SITE_OTHER): Payer: Managed Care, Other (non HMO) | Admitting: Family Medicine

## 2018-07-09 ENCOUNTER — Encounter: Payer: Self-pay | Admitting: Family Medicine

## 2018-07-09 VITALS — BP 98/54 | HR 102 | Temp 98.3°F | Resp 16 | Ht 64.0 in | Wt 132.6 lb

## 2018-07-09 DIAGNOSIS — J020 Streptococcal pharyngitis: Secondary | ICD-10-CM | POA: Diagnosis not present

## 2018-07-09 DIAGNOSIS — R509 Fever, unspecified: Secondary | ICD-10-CM

## 2018-07-09 LAB — POC INFLUENZA A&B (BINAX/QUICKVUE)
Influenza A, POC: NEGATIVE
Influenza B, POC: NEGATIVE

## 2018-07-09 LAB — POCT RAPID STREP A (OFFICE): RAPID STREP A SCREEN: NEGATIVE

## 2018-07-09 MED ORDER — AMOXICILLIN 500 MG PO CAPS: 500.0000 mg | ORAL_CAPSULE | Freq: Two times a day (BID) | ORAL | 0 refills | Status: DC

## 2018-07-09 NOTE — Progress Notes (Signed)
Subjective:    Patient ID: Robyn Gonzalez, female    DOB: 2001/02/23, 18 y.o.   MRN: [id number]  HPI   Patient presents to clinic complaining of headache, sore throat, fever/chills, achiness, ear pain for the past 1 day.  Patient has been using over-the-counter cold and flu medicine with some effect in helping to reduce her symptoms.  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  Denies any shortness of breath or wheezing.  Denies nausea/vomiting or diarrhea.  Denies issues urinating.  Patient Active Problem List   Diagnosis Date Noted  . FH: lupus 04/23/2018  . Chronic fatigue 04/23/2018  . Post-nasal drip 04/23/2018  . Eczema 01/17/2018  . Anxiety and depression 01/17/2018  . Adolescent idiopathic scoliosis of thoracolumbar region 01/17/2018  . Depression 06/13/2009   Social History   Tobacco Use  . Smoking status: Current Every Day Smoker    Types: E-cigarettes    Start date: 01/18/2015  . Smokeless tobacco: Never Used  Substance Use Topics  . Alcohol use: Never    Frequency: Never   Review of Systems  Constitutional: +chills, fatigue and fever.  HENT: +congestion, ear pain, sinus pain and sore throat.   Eyes: Negative.   Respiratory: Negative for cough, shortness of breath and wheezing.   Cardiovascular: Negative for chest pain, palpitations and leg swelling.  Gastrointestinal: Negative for abdominal pain, diarrhea, nausea and vomiting.  Genitourinary: Negative for dysuria, frequency and urgency.  Musculoskeletal: +body aches Skin: Negative for color change, pallor and rash.  Neurological: Negative for syncope, light-headedness. +headache Psychiatric/Behavioral: The patient is not nervous/anxious.       Objective:   Physical Exam Vitals signs and nursing note reviewed.  Constitutional:      General: She is not in acute distress.    Appearance: She is  ill-appearing (appears tired). She is not toxic-appearing or diaphoretic.  HENT:     Head: Normocephalic and atraumatic.     Right Ear: Tenderness present. A middle ear effusion is present.     Left Ear: Tenderness present. A middle ear effusion is present.     Nose: Congestion and rhinorrhea present.     Mouth/Throat:     Pharynx: Uvula midline. No uvula swelling.     Tonsils: Tonsillar exudate present. No tonsillar abscesses. Swelling: 2+ on the right. 2+ on the left.  Eyes:     Pupils: Pupils are equal, round, and reactive to light.  Neck:     Musculoskeletal: Neck supple.  Cardiovascular:     Rate and Rhythm: Normal rate and regular rhythm.  Pulmonary:     Effort: Pulmonary effort is normal. No respiratory distress.     Breath sounds: Normal breath sounds. No wheezing, rhonchi or rales.  Lymphadenopathy:     Cervical: Cervical adenopathy present.  Skin:    General: Skin is warm and dry.     Coloration: Skin is not pale.  Neurological:     Mental Status: She is alert and oriented to person, place, and time.  Psychiatric:        Mood and Affect: Mood normal.        Behavior: Behavior normal.    Vitals:   07/09/18 1410  BP: (!) 98/54  Pulse: (!) 102  Resp: 16  Temp: 98.3 F (36.8 C)  SpO2: 93%      Assessment & Plan:    Streptococcal  sore throat/fever with chills - point-of-care flu and strep swabs are negative in clinic.  However, patient's physical exam is consistent with strep throat and she also works with small children so she has had high risk to exposed to strep throat.  She will take amoxicillin twice daily for 10 days.  Advised to rest, increase fluid intake and do good handwashing.  Also advised to alternate Tylenol Motrin as needed for pain and purchase a new toothbrush.  Out of work note given for patient.  Patient will keep regularly scheduled follow-up as planned.  She will return to clinic sooner if any issues arise.

## 2018-07-09 NOTE — Patient Instructions (Signed)

## 2018-07-12 LAB — CULTURE, UPPER RESPIRATORY
MICRO NUMBER:: 108860
SPECIMEN QUALITY:: ADEQUATE

## 2018-07-24 ENCOUNTER — Ambulatory Visit: Payer: Managed Care, Other (non HMO) | Admitting: Family Medicine

## 2018-08-08 ENCOUNTER — Encounter: Payer: Self-pay | Admitting: Family Medicine

## 2018-08-08 ENCOUNTER — Ambulatory Visit: Payer: Self-pay | Admitting: Family Medicine

## 2018-08-08 VITALS — BP 90/60 | HR 97 | Temp 97.6°F | Resp 16 | Ht 63.0 in | Wt 131.0 lb

## 2018-08-08 DIAGNOSIS — J029 Acute pharyngitis, unspecified: Secondary | ICD-10-CM

## 2018-08-08 DIAGNOSIS — H6690 Otitis media, unspecified, unspecified ear: Secondary | ICD-10-CM

## 2018-08-08 DIAGNOSIS — H1033 Unspecified acute conjunctivitis, bilateral: Secondary | ICD-10-CM

## 2018-08-08 LAB — POCT RAPID STREP A (OFFICE): RAPID STREP A SCREEN: NEGATIVE

## 2018-08-08 MED ORDER — POLYMYXIN B-TRIMETHOPRIM 10000-0.1 UNIT/ML-% OP SOLN: 1.0000 [drp] | OPHTHALMIC | 0 refills | Status: DC

## 2018-08-08 MED ORDER — AZITHROMYCIN 250 MG PO TABS: ORAL_TABLET | ORAL | 0 refills | Status: DC

## 2018-08-08 NOTE — Progress Notes (Signed)
Subjective:    Patient ID: Robyn Gonzalez, female    DOB: 2001/03/30, 18 y.o.   MRN: [id number]  HPI   Patient presents to clinic complaining of bilateral ear pain, yellow crusting and discharge from both eyes, sore throat for the past 4 to 5 days.  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  Also reports 1 of the children in her daycare class was diagnosed with pinkeye.  Denies fever or chills.  Denies body aches.  Denies cough, shortness of breath or wheezing.  Denies nausea/vomiting or diarrhea.  Patient Active Problem List   Diagnosis Date Noted  . FH: lupus 04/23/2018  . Chronic fatigue 04/23/2018  . Post-nasal drip 04/23/2018  . Eczema 01/17/2018  . Anxiety and depression 01/17/2018  . Adolescent idiopathic scoliosis of thoracolumbar region 01/17/2018  . Depression 06/13/2009   Social History   Tobacco Use  . Smoking status: Current Every Day Smoker    Types: E-cigarettes    Start date: 01/18/2015  . Smokeless tobacco: Never Used  Substance Use Topics  . Alcohol use: Never    Frequency: Never   Review of Systems  Constitutional: Negative for chills, fatigue and fever.  HENT: +congestion, ear pain, sinus pressure and sore throat.   Eyes: +bilat eye crusting, yellow drainage  Respiratory: Negative for cough, shortness of breath and wheezing.   Cardiovascular: Negative for chest pain, palpitations and leg swelling.  Gastrointestinal: Negative for abdominal pain, diarrhea, nausea and vomiting.  Genitourinary: Negative for dysuria, frequency and urgency.  Musculoskeletal: Negative for arthralgias and myalgias.  Skin: Negative for color change, pallor and rash.  Neurological: Negative for syncope, light-headedness and headaches.  Psychiatric/Behavioral: The patient is not nervous/anxious.       Objective:   Physical Exam Vitals signs and nursing note reviewed.  Constitutional:      General: She is not in acute distress.  Appearance: She is not toxic-appearing.  HENT:     Head: Normocephalic and atraumatic.     Right Ear: Hearing, ear canal and external ear normal. A middle ear effusion is present. Tympanic membrane is injected, erythematous and bulging.     Left Ear: Hearing, ear canal and external ear normal. A middle ear effusion is present. Tympanic membrane is injected, erythematous and bulging.     Nose: Rhinorrhea present.     Mouth/Throat:     Mouth: Mucous membranes are moist.     Pharynx: No oropharyngeal exudate.     Comments: +post nasal drip Eyes:     General: No scleral icterus.       Right eye: Discharge present.        Left eye: Discharge present.    Extraocular Movements: Extraocular movements intact.     Pupils: Pupils are equal, round, and reactive to light.     Comments: Tan crusting bilateral lash lines. Conjunctivae red/inflamed.   Cardiovascular:     Rate and Rhythm: Normal rate and regular rhythm.  Pulmonary:     Effort: Pulmonary effort is normal. No respiratory distress.     Breath sounds: Normal breath sounds.    Today's Vitals   08/08/18 1107  BP: 90/60  Pulse: 97  Resp: 16  Temp: 97.6 F (36.4 C)  TempSrc: Oral  SpO2: 99%  Weight: 131 lb (59.4 kg)  Height: 5\' 3"  (1.6 m)   Body mass index is 23.21 kg/m.     Assessment & Plan:   Bilateral otitis media -  patient will continue daily allergy medication and use a saline or Flonase nasal spray.  She will take azithromycin course to treat ear infection, azithromycin chosen due to a recent amoxicillin course for strep throat approximately 1-1/17-month ago.  Advised to rest, increase fluid intake and do good handwashing.  Acute conjunctivitis - patient does have redness of the conjunctive as well as tan crusting along lash line this in combination with exposure to pinkeye, we will treat with Polytrim eyedrops.  Patient given out of work note for today to allow time to rest and recover.  Patient will follow-up in  approximately 3 months for management of chronic medical conditions.  She is aware she can return to clinic sooner if any issues arise.

## 2018-08-27 ENCOUNTER — Ambulatory Visit (INDEPENDENT_AMBULATORY_CARE_PROVIDER_SITE_OTHER): Payer: Managed Care, Other (non HMO) | Admitting: Family Medicine

## 2018-08-27 ENCOUNTER — Encounter: Payer: Self-pay | Admitting: Family Medicine

## 2018-08-27 ENCOUNTER — Other Ambulatory Visit: Payer: Self-pay

## 2018-08-27 VITALS — BP 98/64 | HR 97 | Temp 98.0°F | Ht 63.0 in | Wt 134.0 lb

## 2018-08-27 DIAGNOSIS — R21 Rash and other nonspecific skin eruption: Secondary | ICD-10-CM | POA: Diagnosis not present

## 2018-08-27 MED ORDER — METHYLPREDNISOLONE 4 MG PO TBPK: ORAL_TABLET | ORAL | 0 refills | Status: DC

## 2018-08-27 NOTE — Patient Instructions (Signed)
Rash, Adult    A rash is a change in the color of your skin. A rash can also change the way your skin feels. There are many different conditions and factors that can cause a rash.  Follow these instructions at home:  The goal of treatment is to stop the itching and keep the rash from spreading. Watch for any changes in your symptoms. Let your doctor know about them. Follow these instructions to help with your condition:  Medicine  Take or apply over-the-counter and prescription medicines only as told by your doctor. These may include medicines:   To treat red or swollen skin (corticosteroid creams).   To treat itching.   To treat an allergy (oral antihistamines).   To treat very bad symptoms (oral corticosteroids).    Skin care   Put cool cloths (compresses) on the affected areas.   Do not scratch or rub your skin.   Avoid covering the rash. Make sure that the rash is exposed to air as much as possible.  Managing itching and discomfort   Avoid hot showers or baths. These can make itching worse. A cold shower may help.   Try taking a bath with:  ? Epsom salts. You can get these at your local pharmacy or grocery store. Follow the instructions on the package.  ? Baking soda. Pour a small amount into the bath as told by your doctor.  ? Colloidal oatmeal. You can get this at your local pharmacy or grocery store. Follow the instructions on the package.   Try putting baking soda paste onto your skin. Stir water into baking soda until it gets like a paste.   Try putting on a lotion that relieves itchiness (calamine lotion).   Keep cool and out of the sun. Sweating and being hot can make itching worse.  General instructions     Rest as needed.   Drink enough fluid to keep your pee (urine) pale yellow.   Wear loose-fitting clothing.   Avoid scented soaps, detergents, and perfumes. Use gentle soaps, detergents, perfumes, and other cosmetic products.   Avoid anything that causes your rash. Keep a journal to  help track what causes your rash. Write down:  ? What you eat.  ? What cosmetic products you use.  ? What you drink.  ? What you wear. This includes jewelry.   Keep all follow-up visits as told by your doctor. This is important.  Contact a doctor if:   You sweat at night.   You lose weight.   You pee (urinate) more than normal.   You pee less than normal, or you notice that your pee is a darker color than normal.   You feel weak.   You throw up (vomit).   Your skin or the whites of your eyes look yellow (jaundice).   Your skin:  ? Tingles.  ? Is numb.   Your rash:  ? Does not go away after a few days.  ? Gets worse.   You are:  ? More thirsty than normal.  ? More tired than normal.   You have:  ? New symptoms.  ? Pain in your belly (abdomen).  ? A fever.  ? Watery poop (diarrhea).  Get help right away if:   You have a fever and your symptoms suddenly get worse.   You start to feel mixed up (confused).   You have a very bad headache or a stiff neck.   You have very bad joint pains   or stiffness.   You have jerky movements that you cannot control (seizure).   Your rash covers all or most of your body. The rash may or may not be painful.   You have blisters that:  ? Are on top of the rash.  ? Grow larger.  ? Grow together.  ? Are painful.  ? Are inside your nose or mouth.   You have a rash that:  ? Looks like purple pinprick-sized spots all over your body.  ? Has a "bull's eye" or looks like a target.  ? Is red and painful, causes your skin to peel, and is not from being in the sun too long.  Summary   A rash is a change in the color of your skin. A rash can also change the way your skin feels.   The goal of treatment is to stop the itching and keep the rash from spreading.   Take or apply over-the-counter and prescription medicines only as told by your doctor.   Contact a doctor if you have new symptoms or symptoms that get worse.   Keep all follow-up visits as told by your doctor. This is  important.  This information is not intended to replace advice given to you by your health care provider. Make sure you discuss any questions you have with your health care provider.  Document Released: 11/16/2007 Document Revised: 01/01/2018 Document Reviewed: 01/01/2018  [employer] Interactive Patient Education  2019 [employer]

## 2018-08-27 NOTE — Progress Notes (Signed)
   Subjective:    Patient ID: Robyn Gonzalez, female    DOB: 06-23-2000, 18 y.o.   MRN: [id number]  HPI   Patient presents to clinic complaining of rash on both forearms after wearing a new sweatshirt she [place].  Patient states she knows that she has sensitive skin, but wore this pressure immediately after purchasing.  Wonders if skin is having an irritation/inflammation related to the dyes or fabric of the sweatshirt.  Patient Active Problem List   Diagnosis Date Noted  . FH: lupus 04/23/2018  . Chronic fatigue 04/23/2018  . Post-nasal drip 04/23/2018  . Eczema 01/17/2018  . Anxiety and depression 01/17/2018  . Adolescent idiopathic scoliosis of thoracolumbar region 01/17/2018  . Depression 06/13/2009   Social History   Tobacco Use  . Smoking status: Current Every Day Smoker    Types: E-cigarettes    Start date: 01/18/2015  . Smokeless tobacco: Never Used  Substance Use Topics  . Alcohol use: Never    Frequency: Never    Review of Systems   Constitutional: Negative for chills, fatigue and fever.  HENT: Negative for congestion, ear pain, sinus pain and sore throat.   Eyes: Negative.   Respiratory: Negative for cough, shortness of breath and wheezing.   Cardiovascular: Negative for chest pain, palpitations and leg swelling.  Gastrointestinal: Negative for abdominal pain, diarrhea, nausea and vomiting.  Genitourinary: Negative for dysuria, frequency and urgency.  Musculoskeletal: Negative for arthralgias and myalgias.  Skin: +rash bilat arms, itching Neurological: Negative for syncope, light-headedness and headaches.  Psychiatric/Behavioral: The patient is not nervous/anxious.       Objective:   Physical Exam Vitals signs and nursing note reviewed.  Constitutional:      General: She is not in acute distress.    Appearance: She is not toxic-appearing.  HENT:     Head: Normocephalic and atraumatic.  Cardiovascular:     Rate and  Rhythm: Normal rate and regular rhythm.  Pulmonary:     Effort: Pulmonary effort is normal. No respiratory distress.     Breath sounds: Normal breath sounds.  Skin:    General: Skin is warm and dry.     Coloration: Skin is not jaundiced or pale.     Findings: Rash (faint maculopapular rash on bilateral forearms and upper arms) present.  Neurological:     Mental Status: She is alert and oriented to person, place, and time.  Psychiatric:        Mood and Affect: Mood normal.        Behavior: Behavior normal.    Vitals:   08/27/18 1451  BP: 98/64  Pulse: 97  Temp: 98 F (36.7 C)  SpO2: 99%      Assessment & Plan:    Rash-suspect rash is related to a contact dermatitis due to a dye or fabric in her new sweatshirt from the beach.  Patient will take oral Medrol Dosepak to calm down bodies inflammation response, also advised to take Claritin every a.m. to reduce histamine response.  Can apply topical hydrocortisone 1% cream to calm rash, this is OTC.  Keep regular follow up as planned. Return to clinic sooner if issues arise.

## 2018-09-04 ENCOUNTER — Telehealth: Payer: Self-pay

## 2018-09-04 NOTE — Telephone Encounter (Signed)
Got a hold of patient via telephone. Stated she decided to go to work today and will do the [employer] ex visit tomorrow. Scheduled for web ex visit 09/05/2018 at 9 AM

## 2018-09-04 NOTE — Telephone Encounter (Signed)
Please get patient set up for virtual visit via web ex  She can download app on her phone

## 2018-09-04 NOTE — Telephone Encounter (Signed)
Pt called office about sx.  Pt stated that onset of symptoms started on Sat or Sun. Pt states that she has a sore throat, cough, congestion, ear pain, a headache, and diarrhea.  Pt stated that she had diarrhea twice yesterday with last loose stool being around 12 noon on yesterday.  Pt checked temp while on phone.  Pt said temp was 97.2  Pt said that she traveled to [facility] 2 weeks ago.  Pt says that she lives with her mother who is also sick but pt said that she is not sure what her mother's symptoms are.  Advised pt to monitor temperature and to isolate for 3 days according to [facility] protocol since pt has no fever.  Advised pt to go to ED or UC if symptoms worsen especially if fever is >100.4 or if SOB occurs.  Informed pt that notes will be forwarded to PCP to see if a virtual visit will be advised.    Pt requested call back tomorrow before 1 pm since she is scheduled to work.

## 2018-09-04 NOTE — Telephone Encounter (Signed)
Called Pt No answer, left a VM, will try back later.

## 2018-09-04 NOTE — Telephone Encounter (Signed)
Pt called [facility] Pt called office about sx.  Pt stated that onset of symptoms started on Sat or Sun. Pt states that she has a sore throat, cough, congestion, ear pain, a headache, and diarrhea.  Pt stated that she had diarrhea twice yesterday with last loose stool being around 12 noon on yesterday.  Pt checked temp while on phone.  Pt said temp was 97.2  Pt said that she traveled to [facility] 2 weeks ago.  Pt says that she lives with her mother who is also sick but pt said that she is not sure what her mother's symptoms are.  Advised pt to monitor temperature and to isolate for 3 days according to [facility] protocol since pt has no fever.  Advised pt to go to ED or UC if symptoms worsen especially if fever is >100.4 or if SOB occurs.  Informed pt that notes will be forwarded to PCP to see if a virtual visit will be advised.    Pt requested call back tomorrow before 1 pm since she is scheduled to work

## 2018-09-05 ENCOUNTER — Telehealth (INDEPENDENT_AMBULATORY_CARE_PROVIDER_SITE_OTHER): Payer: Managed Care, Other (non HMO) | Admitting: Family Medicine

## 2018-09-05 ENCOUNTER — Telehealth: Payer: Self-pay | Admitting: Family Medicine

## 2018-09-05 DIAGNOSIS — R197 Diarrhea, unspecified: Secondary | ICD-10-CM | POA: Diagnosis not present

## 2018-09-05 DIAGNOSIS — R05 Cough: Secondary | ICD-10-CM

## 2018-09-05 DIAGNOSIS — J069 Acute upper respiratory infection, unspecified: Secondary | ICD-10-CM | POA: Diagnosis not present

## 2018-09-05 DIAGNOSIS — H9203 Otalgia, bilateral: Secondary | ICD-10-CM | POA: Diagnosis not present

## 2018-09-05 DIAGNOSIS — R059 Cough, unspecified: Secondary | ICD-10-CM

## 2018-09-05 DIAGNOSIS — R0981 Nasal congestion: Secondary | ICD-10-CM

## 2018-09-05 MED ORDER — BENZONATATE 100 MG PO CAPS: 100.0000 mg | ORAL_CAPSULE | Freq: Three times a day (TID) | ORAL | 0 refills | Status: DC | PRN

## 2018-09-05 NOTE — Telephone Encounter (Signed)
Work note for patient. Aware it is in her mychart

## 2018-09-05 NOTE — Progress Notes (Signed)
Virtual Visit via Video Note  I connected with Robyn Gonzalez on 09/05/18 at  9:00 AM EDT by a video enabled telemedicine application and verified that I am speaking with the correct person using two identifiers. Location patient: home Location provider: [facility] Persons participating in the virtual visit: patient, provider  I discussed the limitations of evaluation and management by telemedicine and the availability of in person appointments. The patient expressed understanding and agreed to proceed.   HPI:  Patient complains of a cough and a scratchy throat that began on Saturday, 08/31/2018.  Also states yesterday her ears began to hurt and had a runny nose with clear drainage.  Denies any fevers.  Only recent travel advised to go to the beach in [place] 2 weeks ago.  Did have one episode of diarrhea on Saturday, but no more diarrhea episodes.  Denies chest pain.  Denies shortness of breath.  Denies nausea or vomiting.  Denies body aches or chills.  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  Does have Claritin and Flonase prescribed to her, but has not currently been using for any of her symptoms.   ROS:  See pertinent positives and negatives per HPI.  Past Medical History:  Diagnosis Date  . Depression 2011  . Migraines 2011    Family History  Problem Relation Age of Onset  . Diabetes type II Father   . Diabetes Mellitus II Maternal Grandmother   . Hypertension Paternal Grandmother   . Hypertension Paternal Grandfather     Social History   Tobacco Use  . Smoking status: Current Every Day Smoker    Types: E-cigarettes    Start date: 01/18/2015  . Smokeless tobacco: Never Used  Substance Use Topics  . Alcohol use: Never    Frequency: Never    Current Outpatient Medications:  .  benzonatate (TESSALON) 100 MG capsule, Take 1 capsule (100 mg total) by mouth 3 (three) times daily as needed., Disp:  30 capsule, Rfl: 0 .  cholecalciferol (VITAMIN D3) 25 MCG (1000 UT) tablet, Take 1 tablet (1,000 Units total) by mouth daily., Disp: 90 tablet, Rfl: 1 .  etonogestrel (NEXPLANON) 68 MG IMPL implant, 1 each by Subdermal route once., Disp: , Rfl:  .  fluticasone (FLONASE) 50 MCG/ACT nasal spray, Place 2 sprays into both nostrils daily., Disp: 16 g, Rfl: 6 .  loratadine (CLARITIN) 10 MG tablet, Take 1 tablet (10 mg total) by mouth daily., Disp: 30 tablet, Rfl: 11 .  methylPREDNISolone (MEDROL DOSEPAK) 4 MG TBPK tablet, Take according to pack instructions, Disp: 21 tablet, Rfl: 0 .  sertraline (ZOLOFT) 50 MG tablet, Take 1 tablet (50 mg total) by mouth daily., Disp: 30 tablet, Rfl: 3 .  vitamin B-12 (CYANOCOBALAMIN) 1000 MCG tablet, Take 1 tablet (1,000 mcg total) by mouth daily., Disp: 90 tablet, Rfl: 1  EXAM:  VITALS per patient if applicable: Denies fever. Counted pulse via radial site - 74 BPM  GENERAL: alert, oriented, appears well and in no acute distress  HEENT: atraumatic, conjunttiva clear, no obvious abnormalities on inspection of external nose and ears  NECK: normal movements of the head and neck  LUNGS: on inspection no signs of respiratory distress, breathing rate appears normal, no obvious gross SOB, gasping or wheezing  CV: no obvious cyanosis  MS: moves all visible extremities without noticeable abnormality  PSYCH/NEURO: pleasant and cooperative, no obvious depression or anxiety, speech and thought processing grossly intact  ASSESSMENT AND PLAN:  Discussed the following assessment and plan:  Discussed with patient that I suspect she most likely has a systemic viral illness causing cough and also could have an exacerbation of seasonal allergies causing the cough, runny nose and ear pain.  She will use Tessalon Perles as needed for cough, she will resume use of Claritin and Flonase as already prescribed to treat your pain and nasal congestion.  Advised if she does have an  episode of diarrhea again in the future she can try over-the-counter Imodium if needed and also advised to keep up good fluid intake and eat a bland diet if she has any upset stomach.  Advised to monitor cell for worsening of symptoms including very high fever, development of any shortness of breath, severe abdominal pain/severe diarrhea or vomiting.  Patient aware if any of these occur she needs to call right away and/or call 911 for emergency evaluation.  Patient given out of work note for out of work for the rest of the week to allow time to rest and recover; work note released to her MyChart account.  Viral URI - Plan: benzonatate (TESSALON) 100 MG capsule  Cough - Plan: benzonatate (TESSALON) 100 MG capsule  Diarrhea, unspecified type - x1 episode, now resolved  Otalgia of both ears - Suspect related to allergies - use flonase and claritin (has at home)  Nasal congestion - Suspect related to allergies - use flonase and claritin (has at home)    I discussed the assessment and treatment plan with the patient. The patient was provided an opportunity to ask questions and all were answered. The patient agreed with the plan and demonstrated an understanding of the instructions.   The patient was advised to call back or seek an in-person evaluation if the symptoms worsen or if the condition fails to improve as anticipated.  I provided 15 minutes of VIDEO chat time during this encounter.   Tracey Harries, FNP

## 2018-11-30 ENCOUNTER — Telehealth: Payer: Self-pay

## 2018-11-30 ENCOUNTER — Encounter: Payer: Self-pay | Admitting: Emergency Medicine

## 2018-11-30 ENCOUNTER — Telehealth: Payer: Self-pay | Admitting: *Deleted

## 2018-11-30 ENCOUNTER — Other Ambulatory Visit: Payer: Self-pay

## 2018-11-30 ENCOUNTER — Other Ambulatory Visit: Payer: Managed Care, Other (non HMO)

## 2018-11-30 ENCOUNTER — Ambulatory Visit
Admission: EM | Admit: 2018-11-30 | Discharge: 2018-11-30 | Disposition: A | Discharge status: Home / Self Care | Payer: Managed Care, Other (non HMO) | Attending: Urgent Care | Admitting: Urgent Care

## 2018-11-30 DIAGNOSIS — R197 Diarrhea, unspecified: Secondary | ICD-10-CM | POA: Diagnosis not present

## 2018-11-30 DIAGNOSIS — J029 Acute pharyngitis, unspecified: Secondary | ICD-10-CM

## 2018-11-30 DIAGNOSIS — Z20822 Contact with and (suspected) exposure to covid-19: Secondary | ICD-10-CM

## 2018-11-30 DIAGNOSIS — B349 Viral infection, unspecified: Secondary | ICD-10-CM | POA: Diagnosis not present

## 2018-11-30 DIAGNOSIS — Z7189 Other specified counseling: Secondary | ICD-10-CM | POA: Diagnosis not present

## 2018-11-30 LAB — RAPID STREP SCREEN (MED CTR MEBANE ONLY): Streptococcus, Group A Screen (Direct): NEGATIVE

## 2018-11-30 NOTE — ED Provider Notes (Signed)
[address], [place], [place] [address] [phone]   Name: Angelik Kay Gonzalez DOB: 29-Jan-2001 MRN: [id number] CSN: [id number] PCP: Jodelle Green, FNP  Arrival date and time:  11/30/18 1017  Chief Complaint:  COVID screening and Sore Throat  NOTE: Prior to seeing the patient today, I have reviewed the triage nursing documentation and vital signs. Clinical staff has updated patient's PMH/PSHx, current medication list, and drug allergies/intolerances to ensure comprehensive history available to assist in medical decision making.   History:   HPI: Robyn Gonzalez is a 18 y.o. female who presents today with complaints of sore throat, intermittent nausea, and diarrhea. GI symptoms started "last week". Her last episode of diarrhea was reported to be yesterday.  She has not experienced any abdominal pain or urinary symptoms. Patient is eating and drinking normally.  Patient advising that she woke up yesterday morning with a sore throat and feelings of general malaise. She proceed to her work at [employer] center, however was subsequently sent home due to having a low grade fever. Patient is unable to report a Tmax. Patient denies any associated cough, chills, or shortness of breath. She is not experiencing any difficulties swallowing. Patient has not tired any conservative symptomatic management ay home beyond increasing her fluid intake.   Past Medical History:  Diagnosis Date  . Depression 2011  . Migraines 2011    History reviewed. No pertinent surgical history.  Family History  Problem Relation Age of Onset  . Diabetes type II Father   . Diabetes Mellitus II Maternal Grandmother   . Hypertension Paternal Grandmother   . Hypertension Paternal Grandfather     Social History   Socioeconomic History  . Marital status: Single    Spouse name: Not on file  . Number of children: Not on file  . Years of education: Not on file  . Highest education  level: Not on file  Occupational History  . Not on file  Social Needs  . Financial resource strain: Not on file  . Food insecurity    Worry: Not on file    Inability: Not on file  . Transportation needs    Medical: Not on file    Non-medical: Not on file  Tobacco Use  . Smoking status: Current Every Day Smoker    Types: E-cigarettes    Start date: 01/18/2015  . Smokeless tobacco: Never Used  Substance and Sexual Activity  . Alcohol use: Never    Frequency: Never  . Drug use: Never  . Sexual activity: Yes    Partners: Male  Lifestyle  . Physical activity    Days per week: Not on file    Minutes per session: Not on file  . Stress: Not on file  Relationships  . Social Herbalist on phone: Not on file    Gets together: Not on file    Attends religious service: Not on file    Active member of club or organization: Not on file    Attends meetings of clubs or organizations: Not on file    Relationship status: Not on file  . Intimate partner violence    Fear of current or ex partner: Not on file    Emotionally abused: Not on file    Physically abused: Not on file    Forced sexual activity: Not on file  Other Topics Concern  . Not on file  Social History Narrative  . Not on file    Patient Active  Problem List   Diagnosis Date Noted  . FH: lupus 04/23/2018  . Chronic fatigue 04/23/2018  . Post-nasal drip 04/23/2018  . Eczema 01/17/2018  . Anxiety and depression 01/17/2018  . Adolescent idiopathic scoliosis of thoracolumbar region 01/17/2018  . Depression 06/13/2009    Home Medications:    Current Meds  Medication Sig  . cholecalciferol (VITAMIN D3) 25 MCG (1000 UT) tablet Take 1 tablet (1,000 Units total) by mouth daily.  Marland Kitchen. etonogestrel (NEXPLANON) 68 MG IMPL implant 1 each by Subdermal route once.  . loratadine (CLARITIN) 10 MG tablet Take 1 tablet (10 mg total) by mouth daily.  . sertraline (ZOLOFT) 50 MG tablet Take 1 tablet (50 mg total) by mouth  daily.  . vitamin B-12 (CYANOCOBALAMIN) 1000 MCG tablet Take 1 tablet (1,000 mcg total) by mouth daily.  . [DISCONTINUED] fluticasone (FLONASE) 50 MCG/ACT nasal spray Place 2 sprays into both nostrils daily.    Allergies:   Patient has no known allergies.  Review of Systems (ROS): Review of Systems  Constitutional: Positive for fatigue and fever ("low grade"; Tmax unknown). Negative for chills.  HENT: Positive for sore throat. Negative for congestion, ear pain, postnasal drip, rhinorrhea, sinus pressure, sinus pain and trouble swallowing.   Eyes: Negative for photophobia, pain, redness and itching.  Respiratory: Negative for cough and shortness of breath.   Cardiovascular: Negative for chest pain and palpitations.  Gastrointestinal: Positive for diarrhea and nausea. Negative for abdominal pain and vomiting.  Genitourinary: Negative for dysuria, frequency and urgency.  Musculoskeletal: Negative for back pain, myalgias, neck pain and neck stiffness.  Skin: Negative.   Neurological: Positive for weakness (generalized) and light-headedness. Negative for syncope and headaches.  Hematological: Negative for adenopathy.     Physical Exam:  Triage Vital Signs ED Triage Vitals  Enc Vitals Group     BP 11/30/18 1043 106/64     Pulse Rate 11/30/18 1043 93     Resp 11/30/18 1043 18     Temp 11/30/18 1043 98.5 F (36.9 C)     Temp Source 11/30/18 1043 Oral     SpO2 11/30/18 1043 98 %     Weight 11/30/18 1044 135 lb (61.2 kg)     Height 11/30/18 1044 5\' 4"  (1.626 m)     Head Circumference --      Peak Flow --      Pain Score 11/30/18 1043 0     Pain Loc --      Pain Edu? --      Excl. in GC? --     Physical Exam  Constitutional: She is oriented to person, place, and time and well-developed, well-nourished, and in no distress.  HENT:  Head: Normocephalic and atraumatic.  Right Ear: Hearing, external ear and ear canal normal. A middle ear effusion (very mild) is present.  Left Ear:  Hearing, external ear and ear canal normal. A middle ear effusion (very mild) is present.  Nose: No mucosal edema, rhinorrhea or sinus tenderness.  Mouth/Throat: Mucous membranes are normal. Posterior oropharyngeal erythema present. No oropharyngeal exudate or posterior oropharyngeal edema.  Eyes: Pupils are equal, round, and reactive to light. EOM are normal.  Neck: Normal range of motion. Neck supple. No tracheal deviation present.  Cardiovascular: Normal rate, regular rhythm, normal heart sounds and intact distal pulses. Exam reveals no gallop and no friction rub.  No murmur heard. Pulmonary/Chest: Effort normal and breath sounds normal. No respiratory distress. She has no wheezes. She has no rales.  Abdominal: Soft.  She exhibits no distension. There is no abdominal tenderness.  Lymphadenopathy:    She has no cervical adenopathy.  Neurological: She is alert and oriented to person, place, and time.  Skin: Skin is warm and dry. No rash noted. No erythema.  Psychiatric: Mood, affect and judgment normal.  Nursing note and vitals reviewed.    Urgent Care Treatments / Results:   LABS: PLEASE NOTE: all labs that were ordered this encounter are listed, however only abnormal results are displayed. Labs Reviewed  RAPID STREP SCREEN ([facility] ONLY)  CULTURE, GROUP A STREP [facility])  Drive up SARS-CoV-2 testing [facility])  EKG: -None  RADIOLOGY: No results found.  PROCEDURES: Procedures  MEDICATIONS RECEIVED THIS VISIT: Medications - No data to display  PERTINENT CLINICAL COURSE NOTES/UPDATES:   Initial Impression / Assessment and Plan / Urgent Care Course:    Robyn Gonzalez is a 18 y.o. female who [facility] today with complaints of COVID screening and Sore Throat  Pertinent labs & imaging results that were available during my care of the patient were personally reviewed by me and considered in my medical decision making (see lab/imaging section of note  for values and interpretations).  Patient overall well appearing and in no acute distress today in clinic. Exam reveals mild posterior pharyngeal erythema. Rapid strep swab negative; reflex culture sent. Patient is not tachycardic or febrile. Symptoms consistent with viral illness. 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 Discussed that suspicion for infection is low, however patient adamant that testing has to be performed for her to RTW. Message sent to testing center asking that patient be contacted to schedule testing. In the interim, recommended salt water gargles and/or lozenges for her sore throat. May use OTC loperamide PRN for diarrhea. Encouraged to increased fluid intake. May use Tylenol and/or Ibuprofen as needed for pain/fever.   Current clinical condition warrants patient being out of work in order to recover from her current injury/illness. She was provided with the appropriate documentation to provide to her place of employment that will allow for her to RTW on 12/03/2018 with no restrictions. Discussed that if testing not done today, she will not be able to RTW until negative results received. Recommended self quarantine [facility] until testing results received.   Discussed follow up with primary care physician in 1 week for re-evaluation. I have reviewed the follow up and strict return precautions for any new or worsening symptoms. Patient is aware of symptoms that would be deemed urgent/emergent, and would thus require further evaluation either here or in the emergency department. At the time of discharge, she verbalized understanding and consent with the discharge plan as it was reviewed with her. All questions were fielded by provider and/or clinic staff prior to patient discharge.    Final Clinical Impressions(s) / Urgent Care Diagnoses:   Final diagnoses:  Pharyngitis with viral syndrome  Advice Given About Covid-19 Virus  Infection  Diarrhea, unspecified type    New Prescriptions:  No orders of the defined types were placed in this encounter.   Controlled Substance Prescriptions:  [place] Controlled Substance Registry consulted? Not Applicable  NOTE: This note was prepared using Dragon dictation software along with smaller phrase technology. Despite my best ability to proofread, there is the potential that transcriptional errors may still occur from this process, and are completely unintentional.     Verlee MonteGray, Laquincy Eastridge E, NP 11/30/18 1142

## 2018-11-30 NOTE — ED Triage Notes (Signed)
Patient here for COVID screening. Patient states she is having sore throat, nausea and "low grade fever," She is unsure of what her temp was but her job stated she couldn't return to work without COVID testing.

## 2018-11-30 NOTE — Telephone Encounter (Signed)
Pt called and scheduled for testing at [facility] site on 11/30/18. Pt advised to wear a mask and remain in car for appt. Pt verbalized understanding.

## 2018-11-30 NOTE — Telephone Encounter (Signed)
-----   Message from Karen Kitchens, NP sent at 11/30/2018 11:02 AM EDT ----- Needs COVID testing. In [facility] clinic now and is amenable to coming now if appointment available. Please call us at [phone] if you are able to get her in.

## 2018-11-30 NOTE — Discharge Instructions (Addendum)
It was very nice seeing you today in clinic. Thank you for entrusting me with your care.   May use Imodium as needed for diarrhea. May use Tylenol and/or Ibuprofen as needed for pain/fever. Salt water gargles and lozenges will help sooth your throat.   Increase fluid intake as much as possible. Water is always best, as sugar and caffeine containing fluids can cause you to become dehydrated. Try to incorporate electrolyte enriched fluids, such as Gatorade or Pedialyte, into your daily fluid intake.    Will send for COVID testing. You should be hearing from the testing center to schedule an appointment.   Make arrangements to follow up with your regular doctor in 1 week for re-evaluation.  If your symptoms/condition worsens, please seek follow up care either here or in the ER. Please remember, our [place] providers are "right here with you" when you need [place].   Again, it was my pleasure to take care of you today. Thank you for choosing our clinic. I hope that you start to feel better quickly.   Honor Loh, MSN, APRN, FNP-C, CEN Advanced Practice Provider [facility]

## 2018-11-30 NOTE — Telephone Encounter (Signed)
Copied from [place] [phone]. Topic: Appointment Scheduling - Scheduling Inquiry for Clinic >> Nov 30, 2018  9:10 AM Rainey Pines A wrote: Patient wants the next available appointment for her nausea and a callback

## 2018-11-30 NOTE — Telephone Encounter (Signed)
Called and spoke to patient.  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  Patient said that her job is requiring her to be tested for [place].  Patient said that she is on her way to Urgent Care in [place].  Patient aware that her PCP is out of the office today and was informed that notes will be forwarded to PCP as FYI.

## 2018-12-03 LAB — CULTURE, GROUP A STREP (THRC)

## 2018-12-04 LAB — NOVEL CORONAVIRUS, NAA: SARS-CoV-2, NAA: NOT DETECTED

## 2018-12-04 NOTE — Telephone Encounter (Signed)
Noted, thank you

## 2018-12-17 ENCOUNTER — Other Ambulatory Visit: Payer: Self-pay

## 2018-12-17 ENCOUNTER — Ambulatory Visit (INDEPENDENT_AMBULATORY_CARE_PROVIDER_SITE_OTHER): Payer: Managed Care, Other (non HMO)

## 2018-12-17 ENCOUNTER — Ambulatory Visit
Admission: EM | Admit: 2018-12-17 | Discharge: 2018-12-17 | Disposition: A | Discharge status: Home / Self Care | Payer: Managed Care, Other (non HMO) | Attending: Urgent Care | Admitting: Urgent Care

## 2018-12-17 DIAGNOSIS — S93401A Sprain of unspecified ligament of right ankle, initial encounter: Secondary | ICD-10-CM | POA: Diagnosis not present

## 2018-12-17 DIAGNOSIS — M25571 Pain in right ankle and joints of right foot: Secondary | ICD-10-CM

## 2018-12-17 DIAGNOSIS — W108XXA Fall (on) (from) other stairs and steps, initial encounter: Secondary | ICD-10-CM

## 2018-12-17 MED ORDER — KETOROLAC TROMETHAMINE 10 MG PO TABS: 10.0000 mg | ORAL_TABLET | Freq: Three times a day (TID) | ORAL | 0 refills | Status: DC | PRN

## 2018-12-17 NOTE — Discharge Instructions (Addendum)
It was very nice seeing you today in clinic. Thank you for entrusting me with your care.  ° °Please utilize the medications that we discussed. Your prescriptions have been called in to your pharmacy.  ° °Make arrangements to follow up with your regular doctor in 1 week for re-evaluation if not improving. If your symptoms/condition worsens, please seek follow up care either here or in the ER. Please remember, our [facility] providers are "right here with you" when you need us.  ° °Again, it was my pleasure to take care of you today. Thank you for choosing our clinic. I hope that you start to feel better quickly.  ° °Aronda Burford, MSN, APRN, FNP-C, CEN °Advanced Practice Provider °[facility] ° °

## 2018-12-17 NOTE — ED Triage Notes (Signed)
Pt here with ankle injury sustained 12/15/18 after slipping on a step, landed on ankle

## 2018-12-17 NOTE — ED Provider Notes (Addendum)
Rodriguez Hevia, Shepherd   Name: Robyn Gonzalez DOB: [birth year] MRN: [id number] CSN: [id number] PCP: Jodelle Green, FNP  Arrival date and time:  12/17/18 1339  Chief Complaint:  Ankle Pain   NOTE: Prior to seeing the patient today, I have reviewed the triage nursing documentation and vital signs. Clinical staff has updated patient's PMH/PSHx, current medication list, and drug allergies/intolerances to ensure comprehensive history available to assist in medical decision making.   History:   HPI: Robyn Gonzalez is a 18 y.o. female who presents today with complaints of RIGHT ankle pain since Saturday (12/15/2018).  Patient notes that she was walking on the steps when she sustained a fall causing an inversion injury.  When patient fell she notes that she landed on her ankle.  Patient denies hitting her head; no LOC. She has experienced pain and swelling since the incident.  Patient notes difficulties with weightbearing activities.  She has full AROM. Patient denies previous injuries to this ankle; she has never required surgical intervention.  In efforts to conservatively manage her symptoms at home, the patient notes that she has used ibuprofen and ice, which has helped to improve her symptoms.    Past Medical History:  Diagnosis Date  . Depression 2011  . Migraines 2011    History reviewed. No pertinent surgical history.  Family History  Problem Relation Age of Onset  . Diabetes type II Father   . Diabetes Mellitus II Maternal Grandmother   . Hypertension Paternal Grandmother   . Hypertension Paternal Grandfather     Social History   Tobacco Use  . Smoking status: Current Every Day Smoker    Types: E-cigarettes    Start date: 01/18/2015  . Smokeless tobacco: Never Used  Substance Use Topics  . Alcohol use: Never    Frequency: Never  . Drug use: Never    Patient Active Problem List   Diagnosis Date Noted  . FH: lupus 04/23/2018  . Chronic fatigue 04/23/2018  . Post-nasal  drip 04/23/2018  . Eczema 01/17/2018  . Anxiety and depression 01/17/2018  . Adolescent idiopathic scoliosis of thoracolumbar region 01/17/2018  . Depression 06/13/2009    Home Medications:    Current Meds  Medication Sig  . etonogestrel (NEXPLANON) 68 MG IMPL implant 1 each by Subdermal route once.    Allergies:   Patient has no known allergies.  Review of Systems (ROS): Review of Systems  Constitutional: Negative for chills and fever.  Respiratory: Negative for cough and shortness of breath.   Cardiovascular: Negative for chest pain and palpitations.  Musculoskeletal: Positive for joint swelling.       Acute RIGHT ankle pain status post fall  Skin: Negative for color change and wound.  Neurological: Negative for dizziness, syncope, weakness and headaches.  Hematological: Negative for adenopathy.     Vital Signs: Today's Vitals   12/17/18 1428 12/17/18 1431 12/17/18 1432 12/17/18 1518  BP: 104/63     Pulse: (!) 106     Resp: 16     Temp: 98.5 F (36.9 C)     TempSrc: Oral     SpO2: 100%  100%   Weight:   140 lb (63.5 kg)   Height:   5\' 4"  (1.626 m)   PainSc:  5   5     Physical Exam: Physical Exam  Constitutional: She is oriented to person, place, and time and well-developed, well-nourished, and in no distress.  HENT:  Head: Normocephalic and atraumatic.  Mouth/Throat: Mucous membranes are  normal.  Eyes: Pupils are equal, round, and reactive to light. EOM are normal.  Cardiovascular: Normal rate, regular rhythm, normal heart sounds and intact distal pulses. Exam reveals no gallop and no friction rub.  No murmur heard. Pulmonary/Chest: Effort normal and breath sounds normal. No respiratory distress. She has no wheezes. She has no rales.  Musculoskeletal:     Right ankle: She exhibits decreased range of motion, swelling and ecchymosis (Minimal). She exhibits no deformity, no laceration and normal pulse. Tenderness (Generalized).  Neurological: She is alert and  oriented to person, place, and time. Gait normal. GCS score is 15.  Skin: Skin is warm and dry. No rash noted.  Psychiatric: Mood, memory, affect and judgment normal.  Nursing note and vitals reviewed.   Urgent Care Treatments / Results:   LABS: PLEASE NOTE: all labs that were ordered this encounter are listed, however only abnormal results are displayed. Labs Reviewed - No data to display  EKG: -None  RADIOLOGY: Dg Ankle Complete Right  Result Date: 12/17/2018 CLINICAL DATA:  Twisting injury 2 days ago with persistent ankle pain, initial encounter EXAM: RIGHT ANKLE - COMPLETE 3+ VIEW COMPARISON:  None. FINDINGS: There is no evidence of fracture, dislocation, or joint effusion. There is no evidence of arthropathy or other focal bone abnormality. Soft tissues are unremarkable. IMPRESSION: No acute abnormality noted. Electronically Signed   By: Alcide CleverMark  Lukens M.D.   On: 12/17/2018 15:07    PROCEDURES: Procedures  MEDICATIONS RECEIVED THIS VISIT: Medications - No data to display  PERTINENT CLINICAL COURSE NOTES/UPDATES:   Initial Impression / Assessment and Plan / Urgent Care Course:  Pertinent labs & imaging results that were available during my care of the patient were personally reviewed by me and considered in my medical decision making (see lab/imaging section of note for values and interpretations).  Robyn Gonzalez is a 18 y.o. female who [facility] with complaints of Ankle Pain   Patient overall well appearing and in no acute distress today in clinic. Exam reveals reveals generalized tenderness and swelling to patient's RIGHT ankle status post fall 1 12/15/2018.  There is no obvious deformity noted. (+) PMS; no discoloration, temperature alteration, or decreased capillary refill noted.  AROM elicits pain, as does ambulation.  Diagnostic plain films of the ankle reveals no acute fracture, dislocation, or effusion.  Discussed conservative management  using RICE modalities.  Patient placed in a compression wrap for comfort and stability. Will use anti-inflammatory (ketorolac) as needed for pain.    Discussed follow up with primary care physician in 1 week for re-evaluation. I have reviewed the follow up and strict return precautions for any new or worsening symptoms. Patient is aware of symptoms that would be deemed urgent/emergent, and would thus require further evaluation either here or in the emergency department. At the time of discharge, she verbalized understanding and consent with the discharge plan as it was reviewed with her. All questions were fielded by provider and/or clinic staff prior to patient discharge.    Final Clinical Impressions / Urgent Care Diagnoses:   Final diagnoses:  Sprain of right ankle, unspecified ligament, initial encounter    New Prescriptions:  [place] Controlled Substance Registry consulted? Not Applicable  Meds ordered this encounter  Medications  . ketorolac (TORADOL) 10 MG tablet    Sig: Take 1 tablet (10 mg total) by mouth every 8 (eight) hours as needed.    Dispense:  15 tablet    Refill:  0  Recommended Follow up Care:  Patient encouraged to follow up with the following provider within the specified time frame, or sooner as dictated by the severity of her symptoms. As always, she was instructed that for any urgent/emergent care needs, she should seek care either here or in the emergency department for more immediate evaluation. Follow-up Information    Guse, Janna ArchLauren M, FNP In 1 week.   Specialty: Family Medicine Why: General reassessment of symptoms if not improving Contact information: [address] [place] [id number] [phone]          NOTE: This note was prepared using Dragon dictation software along with smaller phrase technology. Despite my best ability to proofread, there is the potential that transcriptional errors may still occur from this process, and are  completely unintentional.     Verlee MonteGray, Therasa Lorenzi E, NP 12/19/18 [phone]

## 2019-01-10 ENCOUNTER — Telehealth: Payer: Self-pay

## 2019-01-10 NOTE — Telephone Encounter (Signed)
Copied from [facility] [id number]. Topic: General - Other >> Jan 10, 2019 12:56 PM Leward Quan A wrote: Reason for CRM: Patient call to say that she have been vomiting and not feeling well and her employer want her to be covid tested. Please place orders. Ph# [phone]

## 2019-01-10 NOTE — Telephone Encounter (Signed)
Called pt triage and to schedule appt.  No answer.  LMTCB.

## 2019-01-11 ENCOUNTER — Telehealth: Payer: Self-pay | Admitting: Lab

## 2019-01-11 ENCOUNTER — Other Ambulatory Visit: Payer: Self-pay

## 2019-01-11 ENCOUNTER — Telehealth: Payer: Self-pay

## 2019-01-11 DIAGNOSIS — Z20822 Contact with and (suspected) exposure to covid-19: Secondary | ICD-10-CM

## 2019-01-11 NOTE — Telephone Encounter (Signed)
See note from [facility] no answer.

## 2019-01-11 NOTE — Telephone Encounter (Signed)
I called the Pt and I reached her to see how she was feeling to schedule her an appt. Pt stated to me she self medicated then went to one of the COVID testing stations today.

## 2019-01-11 NOTE — Telephone Encounter (Signed)
Thanks for trying Sharee Pimple!

## 2019-01-11 NOTE — Telephone Encounter (Signed)
Called Pt back to try to get her a Virtual or phone visit today, Pt declined because she stated she is driving.

## 2019-01-11 NOTE — Telephone Encounter (Signed)
Copied from CRM #[id number]. Topic: General - Other >> Jan 10, 2019 12:56 PM Davis, Karen A wrote: Reason for CRM: Patient call to say that she have been vomiting and not feeling well and her employer want her to be covid tested. Please place orders. Ph# [phone] 

## 2019-01-11 NOTE — Telephone Encounter (Signed)
Pt called Robyn Gonzalez yesteday Reason for CRM: Patient call to say that she have been vomiting and not feeling well and her employer want her to be covid tested. Please place orders. Ph# [phone]  Its noted thePec attempted to reach the Pt a few time No answer.  I called the Pt and I reached her to see how she was feeling to schedule her an appt. Pt stated to me she self medicated then went to one of the COVID testing stations today.

## 2019-01-11 NOTE — Telephone Encounter (Signed)
Can she do a virtual visit now?  I would prefer we do a visit and also orders rather than patients just calling to get orders placed.

## 2019-01-13 LAB — NOVEL CORONAVIRUS, NAA: SARS-CoV-2, NAA: NOT DETECTED

## 2019-01-14 ENCOUNTER — Other Ambulatory Visit: Payer: Self-pay

## 2019-01-14 ENCOUNTER — Ambulatory Visit (INDEPENDENT_AMBULATORY_CARE_PROVIDER_SITE_OTHER): Payer: Managed Care, Other (non HMO) | Admitting: Family Medicine

## 2019-01-14 DIAGNOSIS — R112 Nausea with vomiting, unspecified: Secondary | ICD-10-CM

## 2019-01-14 MED ORDER — FAMOTIDINE 20 MG PO TABS: 20.0000 mg | ORAL_TABLET | Freq: Every day | ORAL | 2 refills | Status: DC

## 2019-01-14 MED ORDER — ONDANSETRON 4 MG PO TBDP: 4.0000 mg | ORAL_TABLET | Freq: Three times a day (TID) | ORAL | 1 refills | Status: DC | PRN

## 2019-01-14 NOTE — Progress Notes (Signed)
Patient ID: Robyn Gonzalez, female   DOB: 2000-09-03, 18 y.o.   MRN: [id number]    Virtual Visit via video Note  This visit type was conducted due to national recommendations for restrictions regarding the COVID-19 pandemic (e.g. social distancing).  This format is felt to be most appropriate for this patient at this time.  All issues noted in this document were discussed and addressed.  No physical exam was performed (except for noted visual exam findings with Video Visits).   I connected with Robyn Gonzalez today at  4:00 PM EDT by a video enabled telemedicine application and verified that I am speaking with the correct person using two identifiers. Location patient: home Location provider: work or home office Persons participating in the virtual visit: patient, provider  I discussed the limitations, risks, security and privacy concerns of performing an evaluation and management service by telephone and the availability of in person appointments. I also discussed with the patient that there may be a patient responsible charge related to this service. The patient expressed understanding and agreed to proceed.   HPI:  Patient and I connected via video to discuss symptoms after negative COVID-19 test.  Patient was tested at the end of last week following episode of nausea culminating and generally feeling unwell.  Test came back negative, but patient states she continues to have nausea and did have an episode of vomiting this a.m.  Denies fever or chills.  Denies body aches.  Denies cough, shortness of breath or wheezing.    Patient states she and her mom were talking this weekend and mom brought up that something like this has happened to her her in the past with a chronic nausea that lingered of and on for many weeks.  Patient denies feeling any sensation of heartburn up in chest or any additional belching.  Denies diarrhea.  Has been keeping up good fluid intake and eating bland foods.    ROS: See pertinent positives and negatives per HPI.  Past Medical History:  Diagnosis Date  . Depression 2011  . Migraines 2011    Family History  Problem Relation Age of Onset  . Diabetes type II Father   . Diabetes Mellitus II Maternal Grandmother   . Hypertension Paternal Grandmother   . Hypertension Paternal Grandfather     Social History   Tobacco Use  . Smoking status: Current Every Day Smoker    Types: E-cigarettes    Start date: 01/18/2015  . Smokeless tobacco: Never Used  Substance Use Topics  . Alcohol use: Never    Frequency: Never    Current Outpatient Medications:  .  etonogestrel (NEXPLANON) 68 MG IMPL implant, 1 each by Subdermal route once., Disp: , Rfl:  .  famotidine (PEPCID) 20 MG tablet, Take 1 tablet (20 mg total) by mouth at bedtime., Disp: 30 tablet, Rfl: 2 .  ondansetron (ZOFRAN ODT) 4 MG disintegrating tablet, Take 1 tablet (4 mg total) by mouth every 8 (eight) hours as needed for nausea or vomiting., Disp: 20 tablet, Rfl: 1  EXAM:  GENERAL: alert, oriented, appears well and in no acute distress  HEENT: atraumatic, conjunttiva clear, no obvious abnormalities on inspection of external nose and ears  NECK: normal movements of the head and neck  LUNGS: on inspection no signs of respiratory distress, breathing rate appears normal, no obvious gross SOB, gasping or wheezing  CV: no obvious cyanosis  MS: moves all visible extremities without noticeable abnormality  PSYCH/NEURO: pleasant and cooperative, no obvious  depression or anxiety, speech and thought processing grossly intact  ASSESSMENT AND PLAN:  Discussed the following assessment and plan:  Nausea and vomiting - patient's COVID-19 test was negative, has no other symptoms other than some nausea and episode of vomiting this a.m.  I will send in Zofran to use as needed and we will have her begin a low dose Pepcid at night to see if this helps reduce that chronic nausea.  She does have GI  appointment coming up on August 11.  We will tentatively plan for her to return to work on Wednesday as long as she is feeling better, she is aware that if symptoms not improved she will let me know and we will extend work note.  I discussed the assessment and treatment plan with the patient. The patient was provided an opportunity to ask questions and all were answered. The patient agreed with the plan and demonstrated an understanding of the instructions.   The patient was advised to call back or seek an in-person evaluation if the symptoms worsen or if the condition fails to improve as anticipated.  Jodelle Green, FNP

## 2019-02-21 ENCOUNTER — Other Ambulatory Visit: Payer: Self-pay | Admitting: *Deleted

## 2019-02-21 DIAGNOSIS — Z20822 Contact with and (suspected) exposure to covid-19: Secondary | ICD-10-CM

## 2019-02-22 LAB — NOVEL CORONAVIRUS, NAA: SARS-CoV-2, NAA: NOT DETECTED

## 2021-05-11 ENCOUNTER — Other Ambulatory Visit: Payer: Self-pay

## 2021-05-11 ENCOUNTER — Ambulatory Visit (INDEPENDENT_AMBULATORY_CARE_PROVIDER_SITE_OTHER): Payer: Managed Care, Other (non HMO) | Admitting: Adult Health

## 2021-05-11 ENCOUNTER — Encounter: Payer: Self-pay | Admitting: Adult Health

## 2021-05-11 VITALS — BP 110/70 | HR 91 | Temp 96.2°F | Ht 64.0 in | Wt 182.0 lb

## 2021-05-11 DIAGNOSIS — Z111 Encounter for screening for respiratory tuberculosis: Secondary | ICD-10-CM | POA: Diagnosis not present

## 2021-05-11 DIAGNOSIS — Z1389 Encounter for screening for other disorder: Secondary | ICD-10-CM

## 2021-05-11 DIAGNOSIS — F32A Depression, unspecified: Secondary | ICD-10-CM

## 2021-05-11 DIAGNOSIS — Z8349 Family history of other endocrine, nutritional and metabolic diseases: Secondary | ICD-10-CM

## 2021-05-11 DIAGNOSIS — Z6831 Body mass index (BMI) 31.0-31.9, adult: Secondary | ICD-10-CM

## 2021-05-11 DIAGNOSIS — R5382 Chronic fatigue, unspecified: Secondary | ICD-10-CM | POA: Diagnosis not present

## 2021-05-11 DIAGNOSIS — F419 Anxiety disorder, unspecified: Secondary | ICD-10-CM

## 2021-05-11 NOTE — Patient Instructions (Addendum)
Health Maintenance, Female Adopting a healthy lifestyle and getting preventive care are important in promoting health and wellness. Ask your health care provider about: The right schedule for you to have regular tests and exams. Things you can do on your own to prevent diseases and keep yourself healthy. What should I know about diet, weight, and exercise? Eat a healthy diet  Eat a diet that includes plenty of vegetables, fruits, low-fat dairy products, and lean protein. Do not eat a lot of foods that are high in solid fats, added sugars, or sodium. Maintain a healthy weight Body mass index (BMI) is used to identify weight problems. It estimates body fat based on height and weight. Your health care provider can help determine your BMI and help you achieve or maintain a healthy weight. Get regular exercise Get regular exercise. This is one of the most important things you can do for your health. Most adults should: Exercise for at least 150 minutes each week. The exercise should increase your heart rate and make you sweat (moderate-intensity exercise). Do strengthening exercises at least twice a week. This is in addition to the moderate-intensity exercise. Spend less time sitting. Even light physical activity can be beneficial. Watch cholesterol and blood lipids Have your blood tested for lipids and cholesterol at 22 years of age, then have this test every 5 years. Have your cholesterol levels checked more often if: Your lipid or cholesterol levels are high. You are older than 22 years of age. You are at high risk for heart disease. What should I know about cancer screening? Depending on your health history and family history, you may need to have cancer screening at various ages. This may include screening for: Breast cancer. Cervical cancer. Colorectal cancer. Skin cancer. Lung cancer. What should I know about heart disease, diabetes, and high blood pressure? Blood pressure and heart  disease High blood pressure causes heart disease and increases the risk of stroke. This is more likely to develop in people who have high blood pressure readings or are overweight. Have your blood pressure checked: Every 3-5 years if you are 18-39 years of age. Every year if you are 40 years old or older. Diabetes Have regular diabetes screenings. This checks your fasting blood sugar level. Have the screening done: Once every three years after age 40 if you are at a normal weight and have a low risk for diabetes. More often and at a younger age if you are overweight or have a high risk for diabetes. What should I know about preventing infection? Hepatitis B If you have a higher risk for hepatitis B, you should be screened for this virus. Talk with your health care provider to find out if you are at risk for hepatitis B infection. Hepatitis C Testing is recommended for: Everyone born from 1945 through 1965. Anyone with known risk factors for hepatitis C. Sexually transmitted infections (STIs) Get screened for STIs, including gonorrhea and chlamydia, if: You are sexually active and are younger than 22 years of age. You are older than 22 years of age and your health care provider tells you that you are at risk for this type of infection. Your sexual activity has changed since you were last screened, and you are at increased risk for chlamydia or gonorrhea. Ask your health care provider if you are at risk. Ask your health care provider about whether you are at high risk for HIV. Your health care provider may recommend a prescription medicine to help prevent HIV   infection. If you choose to take medicine to prevent HIV, you should first get tested for HIV. You should then be tested every 3 months for as long as you are taking the medicine. Pregnancy If you are about to stop having your period (premenopausal) and you may become pregnant, seek counseling before you get pregnant. Take 400 to 800  micrograms (mcg) of folic acid every day if you become pregnant. Ask for birth control (contraception) if you want to prevent pregnancy. Osteoporosis and menopause Osteoporosis is a disease in which the bones lose minerals and strength with aging. This can result in bone fractures. If you are 65 years old or older, or if you are at risk for osteoporosis and fractures, ask your health care provider if you should: Be screened for bone loss. Take a calcium or vitamin D supplement to lower your risk of fractures. Be given hormone replacement therapy (HRT) to treat symptoms of menopause. Follow these instructions at home: Alcohol use Do not drink alcohol if: Your health care provider tells you not to drink. You are pregnant, may be pregnant, or are planning to become pregnant. If you drink alcohol: Limit how much you have to: 0-1 drink a day. Know how much alcohol is in your drink. In the [place] one drink equals one 12 oz bottle of beer (355 mL), one 5 oz glass of wine (148 mL), or one 1 oz glass of hard liquor (44 mL). Lifestyle Do not use any products that contain nicotine or tobacco. These products include cigarettes, chewing tobacco, and vaping devices, such as e-cigarettes. If you need help quitting, ask your health care provider. Do not use street drugs. Do not share needles. Ask your health care provider for help if you need support or information about quitting drugs. General instructions Schedule regular health, dental, and eye exams. Stay current with your vaccines. Tell your health care provider if: You often feel depressed. You have ever been abused or do not feel safe at home. Summary Adopting a healthy lifestyle and getting preventive care are important in promoting health and wellness. Follow your health care provider's instructions about healthy diet, exercising, and getting tested or screened for diseases. Follow your health care provider's instructions on monitoring your  cholesterol and blood pressure. This information is not intended to replace advice given to you by your health care provider. Make sure you discuss any questions you have with your health care provider. Document Revised: 10/19/2020 Document Reviewed: 10/19/2020 Elsevier Patient Education  2022 [employer]  Calorie Counting for Weight Loss Calories are units of energy. Your body needs a certain number of calories from food to keep going throughout the day. When you eat or drink more calories than your body needs, your body stores the extra calories mostly as fat. When you eat or drink fewer calories than your body needs, your body burns fat to get the energy it needs. Calorie counting means keeping track of how many calories you eat and drink each day. Calorie counting can be helpful if you need to lose weight. If you eat fewer calories than your body needs, you should lose weight. Ask your health care provider what a healthy weight is for you. For calorie counting to work, you will need to eat the right number of calories each day to lose a healthy amount of weight per week. A dietitian can help you figure out how many calories you need in a day and will suggest ways to reach your calorie goal.   A healthy amount of weight to lose each week is usually 1-2 lb (0.5-0.9 kg). This usually means that your daily calorie intake should be reduced by 500-750 calories. Eating 1,200-1,500 calories a day can help most women lose weight. Eating 1,500-1,800 calories a day can help most men lose weight. What do I need to know about calorie counting? Work with your health care provider or dietitian to determine how many calories you should get each day. To meet your daily calorie goal, you will need to: Find out how many calories are in each food that you would like to eat. Try to do this before you eat. Decide how much of the food you plan to eat. Keep a food log. Do this by writing down what you ate and how many  calories it had. To successfully lose weight, it is important to balance calorie counting with a healthy lifestyle that includes regular activity. Where do I find calorie information? The number of calories in a food can be found on a Nutrition Facts label. If a food does not have a Nutrition Facts label, try to look up the calories online or ask your dietitian for help. Remember that calories are listed per serving. If you choose to have more than one serving of a food, you will have to multiply the calories per serving by the number of servings you plan to eat. For example, the label on a package of bread might say that a serving size is 1 slice and that there are 90 calories in a serving. If you eat 1 slice, you will have eaten 90 calories. If you eat 2 slices, you will have eaten 180 calories. How do I keep a food log? After each time that you eat, record the following in your food log as soon as possible: What you ate. Be sure to include toppings, sauces, and other extras on the food. How much you ate. This can be measured in cups, ounces, or number of items. How many calories were in each food and drink. The total number of calories in the food you ate. Keep your food log near you, such as in a pocket-sized notebook or on an app or website on your mobile phone. Some programs will calculate calories for you and show you how many calories you have left to meet your daily goal. What are some portion-control tips? Know how many calories are in a serving. This will help you know how many servings you can have of a certain food. Use a measuring cup to measure serving sizes. You could also try weighing out portions on a kitchen scale. With time, you will be able to estimate serving sizes for some foods. Take time to put servings of different foods on your favorite plates or in your favorite bowls and cups so you know what a serving looks like. Try not to eat straight from a food's packaging, such as  from a bag or box. Eating straight from the package makes it hard to see how much you are eating and can lead to overeating. Put the amount you would like to eat in a cup or on a plate to make sure you are eating the right portion. Use smaller plates, glasses, and bowls for smaller portions and to prevent overeating. Try not to multitask. For example, avoid watching TV or using your computer while eating. If it is time to eat, sit down at a table and enjoy your food. This will help you recognize  when you are full. It will also help you be more mindful of what and how much you are eating. What are tips for following this plan? Reading food labels Check the calorie count compared with the serving size. The serving size may be smaller than what you are used to eating. Check the source of the calories. Try to choose foods that are high in protein, fiber, and vitamins, and low in saturated fat, trans fat, and sodium. Shopping Read nutrition labels while you shop. This will help you make healthy decisions about which foods to buy. Pay attention to nutrition labels for low-fat or fat-free foods. These foods sometimes have the same number of calories or more calories than the full-fat versions. They also often have added sugar, starch, or salt to make up for flavor that was removed with the fat. Make a grocery list of lower-calorie foods and stick to it. Cooking Try to cook your favorite foods in a healthier way. For example, try baking instead of frying. Use low-fat dairy products. Meal planning Use more fruits and vegetables. One-half of your plate should be fruits and vegetables. Include lean proteins, such as chicken, [place], and fish. Lifestyle Each week, aim to do one of the following: 150 minutes of moderate exercise, such as walking. 75 minutes of vigorous exercise, such as running. General information Know how many calories are in the foods you eat most often. This will help you calculate  calorie counts faster. Find a way of tracking calories that works for you. Get creative. Try different apps or programs if writing down calories does not work for you. What foods should I eat?  Eat nutritious foods. It is better to have a nutritious, high-calorie food, such as an avocado, than a food with few nutrients, such as a bag of potato chips. Use your calories on foods and drinks that will fill you up and will not leave you hungry soon after eating. Examples of foods that fill you up are nuts and nut butters, vegetables, lean proteins, and high-fiber foods such as whole grains. High-fiber foods are foods with more than 5 g of fiber per serving. Pay attention to calories in drinks. Low-calorie drinks include water and unsweetened drinks. The items listed above may not be a complete list of foods and beverages you can eat. Contact a dietitian for more information. What foods should I limit? Limit foods or drinks that are not good sources of vitamins, minerals, or protein or that are high in unhealthy fats. These include: Candy. Other sweets. Sodas, specialty coffee drinks, alcohol, and juice. The items listed above may not be a complete list of foods and beverages you should avoid. Contact a dietitian for more information. How do I count calories when eating out? Pay attention to portions. Often, portions are much larger when eating out. Try these tips to keep portions smaller: Consider sharing a meal instead of getting your own. If you get your own meal, eat only half of it. Before you start eating, ask for a container and put half of your meal into it. When available, consider ordering smaller portions from the menu instead of full portions. Pay attention to your food and drink choices. Knowing the way food is cooked and what is included with the meal can help you eat fewer calories. If calories are listed on the menu, choose the lower-calorie options. Choose dishes that include  vegetables, fruits, whole grains, low-fat dairy products, and lean proteins. Choose items that are boiled, broiled,  grilled, or steamed. Avoid items that are buttered, battered, fried, or served with cream sauce. Items labeled as crispy are usually fried, unless stated otherwise. Choose water, low-fat milk, unsweetened iced tea, or other drinks without added sugar. If you want an alcoholic beverage, choose a lower-calorie option, such as a glass of wine or light beer. Ask for dressings, sauces, and syrups on the side. These are usually high in calories, so you should limit the amount you eat. If you want a salad, choose a garden salad and ask for grilled meats. Avoid extra toppings such as bacon, cheese, or fried items. Ask for the dressing on the side, or ask for olive oil and vinegar or lemon to use as dressing. Estimate how many servings of a food you are given. Knowing serving sizes will help you be aware of how much food you are eating at restaurants. Where to find more information Centers for Disease Control and Prevention: FootballExhibition.com.br [place] Department of Agriculture: WrestlingReporter.dk Summary Calorie counting means keeping track of how many calories you eat and drink each day. If you eat fewer calories than your body needs, you should lose weight. A healthy amount of weight to lose per week is usually 1-2 lb (0.5-0.9 kg). This usually means reducing your daily calorie intake by 500-750 calories. The number of calories in a food can be found on a Nutrition Facts label. If a food does not have a Nutrition Facts label, try to look up the calories online or ask your dietitian for help. Use smaller plates, glasses, and bowls for smaller portions and to prevent overeating. Use your calories on foods and drinks that will fill you up and not leave you hungry shortly after a meal. This information is not intended to replace advice given to you by your health care provider. Make sure you discuss any questions  you have with your health care provider. Document Revised: 07/11/2019 Document Reviewed: 07/11/2019 Elsevier Patient Education  2022 [employer].

## 2021-05-11 NOTE — Progress Notes (Signed)
New Patient Office Visit  Subjective:  Patient ID: Robyn Gonzalez, female    DOB: March 10, 2001  Age: 20 y.o. MRN: [id number]  CC:  Chief Complaint  Patient presents with   Establish Care         HPI Robyn Gonzalez presents for new patient establish care. She feels well. She needs a form filled out for work and PPD test.   She was seeing Dr. In Fara Boros for this, and feels she is stable with [facility],  She politely declines referral to psychology here. No suicidal or homicidal intents or ideas now or in the past reported.  She is not been on medication she reports.  She denies any manic or  depressive episodes.  She reports he been doing very well with this.  Request records from [place] physician was done in the office today.  Provider is unable to see these records.  She also says she had blood work done in the past 2 months there as well labs are sent.   She is seeing OBGYN next week for women's care.  Patient  denies any fever, body aches,chills, rash, chest pain, shortness of breath, nausea, vomiting, or diarrhea.   Patient's last menstrual period was 05/03/2021 (exact date).   Past Medical History:  Diagnosis Date   Depression 2011   Migraines 2011    History reviewed. No pertinent surgical history.  Family History  Problem Relation Age of Onset   Diabetes type II Father    Diabetes Mellitus II Maternal Grandmother    Hypertension Paternal Grandmother    Hypertension Paternal Grandfather     Social History   Socioeconomic History   Marital status: Single    Spouse name: Not on file   Number of children: Not on file   Years of education: Not on file   Highest education level: Not on file  Occupational History   Not on file  Tobacco Use   Smoking status: Every Day    Types: E-cigarettes    Start date: 01/18/2015   Smokeless tobacco: Never  Vaping Use   Vaping Use: Every day   Substances: Nicotine, CBD, Flavoring  Substance and Sexual Activity    Alcohol use: Never   Drug use: Never   Sexual activity: Yes    Partners: Male  Other Topics Concern   Not on file  Social History Narrative   Not on file   Social Determinants of Health   Financial Resource Strain: Not on file  Food Insecurity: Not on file  Transportation Needs: Not on file  Physical Activity: Not on file  Stress: Not on file  Social Connections: Not on file  Intimate Partner Violence: Not on file    ROS Review of Systems  Constitutional:  Positive for fatigue (chronic). Negative for activity change, appetite change, chills, diaphoresis, fever and unexpected weight change.  HENT: Negative.    Respiratory: Negative.    Cardiovascular: Negative.   Gastrointestinal: Negative.   Genitourinary: Negative.   Musculoskeletal: Negative.   Psychiatric/Behavioral:  Negative for agitation, behavioral problems, confusion, decreased concentration, dysphoric mood, hallucinations, self-injury, sleep disturbance and suicidal ideas. The patient is not nervous/anxious and is not hyperactive.    Objective:   Today's Vitals: BP 110/70 (BP Location: Left Arm, Patient Position: Sitting, Cuff Size: Normal)   Pulse 91   Temp (!) 96.2 F (35.7 C) (Temporal)   Ht 5\' 4"  (1.626 m)   Wt 182 lb (82.6 kg)   LMP 05/03/2021 (Exact Date)  SpO2 99%   BMI 31.24 kg/m   Physical Exam Vitals reviewed.  Constitutional:      General: She is not in acute distress.    Appearance: She is not ill-appearing, toxic-appearing or diaphoretic.  HENT:     Head: Normocephalic and atraumatic.     Right Ear: External ear normal.     Left Ear: External ear normal.     Nose: Nose normal.     Mouth/Throat:     Mouth: Mucous membranes are moist.     Pharynx: No oropharyngeal exudate or posterior oropharyngeal erythema.  Eyes:     Extraocular Movements: Extraocular movements intact.     Conjunctiva/sclera: Conjunctivae normal.     Pupils: Pupils are equal, round, and reactive to light.   Cardiovascular:     Rate and Rhythm: Normal rate and regular rhythm.     Pulses: Normal pulses.     Heart sounds: Normal heart sounds. No murmur heard.   No friction rub. No gallop.  Pulmonary:     Effort: Pulmonary effort is normal. No respiratory distress.     Breath sounds: Normal breath sounds. No stridor. No wheezing, rhonchi or rales.  Chest:     Chest wall: No tenderness.  Abdominal:     Palpations: Abdomen is soft.     Tenderness: There is no right CVA tenderness or left CVA tenderness.  Musculoskeletal:        General: Normal range of motion.     Cervical back: Normal range of motion and neck supple.     Right lower leg: No edema.     Left lower leg: No edema.  Lymphadenopathy:     Cervical: No cervical adenopathy.  Skin:    General: Skin is warm.     Findings: No rash.  Neurological:     Mental Status: She is alert and oriented to person, place, and time.     Motor: No weakness.     Gait: Gait normal.  Psychiatric:        Mood and Affect: Mood normal.        Behavior: Behavior normal.        Thought Content: Thought content normal.        Judgment: Judgment normal.    Assessment & Plan:   Problem List Items Addressed This Visit       Other   Anxiety and depression   Chronic fatigue - Primary   Relevant Orders   CBC with Differential/Platelet   Comprehensive metabolic panel   TSH   Lipid panel   Other Visit Diagnoses     Screening for tuberculosis       Relevant Orders   PPD (Completed)   Screening for blood or protein in urine       Relevant Orders   Urinalysis, microscopic only   Family history of B12 deficiency       Relevant Orders   B12   Body mass index (BMI) of 31.0-31.9 in adult           Outpatient Encounter Medications as of 05/11/2021  Medication Sig   [DISCONTINUED] etonogestrel (NEXPLANON) 68 MG IMPL implant 1 each by Subdermal route once.   [DISCONTINUED] famotidine (PEPCID) 20 MG tablet Take 1 tablet (20 mg total) by mouth  at bedtime.   [DISCONTINUED] fluticasone (FLONASE) 50 MCG/ACT nasal spray Place 2 sprays into both nostrils daily.   [DISCONTINUED] loratadine (CLARITIN) 10 MG tablet Take 1 tablet (10 mg total) by mouth daily.   [  DISCONTINUED] ondansetron (ZOFRAN ODT) 4 MG disintegrating tablet Take 1 tablet (4 mg total) by mouth every 8 (eight) hours as needed for nausea or vomiting.   [DISCONTINUED] sertraline (ZOLOFT) 50 MG tablet Take 1 tablet (50 mg total) by mouth daily.   No facility-administered encounter medications on file as of 05/11/2021.  Positive depression scoring of 11 PHQ 9.  Patient declines any need for medication at this time.  She feels she is doing well.  Patient declines counseling or psychiatric care politely.  Orders Placed This Encounter  Procedures   CBC with Differential/Platelet    Standing Status:   Future    Standing Expiration Date:   05/11/2022   Comprehensive metabolic panel    Standing Status:   Future    Standing Expiration Date:   05/11/2022   TSH    Standing Status:   Future    Standing Expiration Date:   05/11/2022   Lipid panel    Standing Status:   Future    Standing Expiration Date:   05/11/2022   Urinalysis, microscopic only    Standing Status:   Future    Standing Expiration Date:   05/11/2022   B12    Standing Status:   Future    Standing Expiration Date:   05/11/2022   PPD    Order Specific Question:   Has patient ever tested positive?    Answer:   No    PPD today.  CPE and check PPD in next  72 hours advised.  Records requested.   Follow-up: Return in about 3 weeks (around 06/01/2021), or if symptoms worsen or fail to improve, for at any time for any worsening symptoms.   Jairo Ben, FNP

## 2021-05-14 ENCOUNTER — Other Ambulatory Visit: Payer: Self-pay

## 2021-05-14 ENCOUNTER — Ambulatory Visit: Payer: Self-pay

## 2021-05-14 DIAGNOSIS — Z111 Encounter for screening for respiratory tuberculosis: Secondary | ICD-10-CM

## 2021-05-14 LAB — TB SKIN TEST
Induration: 0 mm
TB Skin Test: NEGATIVE

## 2021-05-14 NOTE — Progress Notes (Addendum)
Patient presented for PPD Read, results are Negative/0MM.   I have reviewed the above information and agree with above.   Duncan Dull, MD

## 2021-05-25 ENCOUNTER — Encounter: Payer: Self-pay | Admitting: Adult Health

## 2021-05-25 ENCOUNTER — Other Ambulatory Visit: Payer: Self-pay

## 2021-05-25 ENCOUNTER — Ambulatory Visit (INDEPENDENT_AMBULATORY_CARE_PROVIDER_SITE_OTHER): Payer: Managed Care, Other (non HMO) | Admitting: Adult Health

## 2021-05-25 VITALS — BP 114/72 | HR 99 | Temp 96.3°F | Ht 64.02 in | Wt 177.4 lb

## 2021-05-25 DIAGNOSIS — R5382 Chronic fatigue, unspecified: Secondary | ICD-10-CM

## 2021-05-25 DIAGNOSIS — Z8349 Family history of other endocrine, nutritional and metabolic diseases: Secondary | ICD-10-CM | POA: Diagnosis not present

## 2021-05-25 DIAGNOSIS — Z6830 Body mass index (BMI) 30.0-30.9, adult: Secondary | ICD-10-CM

## 2021-05-25 DIAGNOSIS — Z0000 Encounter for general adult medical examination without abnormal findings: Secondary | ICD-10-CM | POA: Diagnosis not present

## 2021-05-25 DIAGNOSIS — F32A Depression, unspecified: Secondary | ICD-10-CM

## 2021-05-25 DIAGNOSIS — Z8659 Personal history of other mental and behavioral disorders: Secondary | ICD-10-CM | POA: Insufficient documentation

## 2021-05-25 DIAGNOSIS — Z1389 Encounter for screening for other disorder: Secondary | ICD-10-CM | POA: Diagnosis not present

## 2021-05-25 LAB — CBC WITH DIFFERENTIAL/PLATELET
Basophils Absolute: 0.1 10*3/uL (ref 0.0–0.1)
Basophils Relative: 0.8 % (ref 0.0–3.0)
Eosinophils Absolute: 0.1 10*3/uL (ref 0.0–0.7)
Eosinophils Relative: 0.9 % (ref 0.0–5.0)
HCT: 39.6 % (ref 36.0–46.0)
Hemoglobin: 12.9 g/dL (ref 12.0–15.0)
Lymphocytes Relative: 27.6 % (ref 12.0–46.0)
Lymphs Abs: 3.1 10*3/uL (ref 0.7–4.0)
MCHC: 32.7 g/dL (ref 30.0–36.0)
MCV: 90.9 fl (ref 78.0–100.0)
Monocytes Absolute: 0.8 10*3/uL (ref 0.1–1.0)
Monocytes Relative: 6.8 % (ref 3.0–12.0)
Neutro Abs: 7.3 10*3/uL (ref 1.4–7.7)
Neutrophils Relative %: 63.9 % (ref 43.0–77.0)
Platelets: 357 10*3/uL (ref 150.0–400.0)
RBC: 4.36 Mil/uL (ref 3.87–5.11)
RDW: 13.2 % (ref 11.5–15.5)
WBC: 11.3 10*3/uL — ABNORMAL HIGH (ref 4.0–10.5)

## 2021-05-25 LAB — COMPREHENSIVE METABOLIC PANEL
ALT: 14 U/L (ref 0–35)
AST: 13 U/L (ref 0–37)
Albumin: 4.2 g/dL (ref 3.5–5.2)
Alkaline Phosphatase: 53 U/L (ref 39–117)
BUN: 11 mg/dL (ref 6–23)
CO2: 26 mEq/L (ref 19–32)
Calcium: 9.6 mg/dL (ref 8.4–10.5)
Chloride: 103 mEq/L (ref 96–112)
Creatinine, Ser: 0.73 mg/dL (ref 0.40–1.20)
GFR: 117.1 mL/min (ref >60.00)
Glucose, Bld: 80 mg/dL (ref 70–99)
Potassium: 4.1 mEq/L (ref 3.5–5.1)
Sodium: 137 mEq/L (ref 135–145)
Total Bilirubin: 0.4 mg/dL (ref 0.2–1.2)
Total Protein: 6.8 g/dL (ref 6.0–8.3)

## 2021-05-25 LAB — URINALYSIS, MICROSCOPIC ONLY

## 2021-05-25 LAB — TSH: TSH: 2.18 u[IU]/mL (ref 0.35–5.50)

## 2021-05-25 LAB — LIPID PANEL
Cholesterol: 156 mg/dL (ref 0–200)
HDL: 47 mg/dL (ref >39.00)
LDL Cholesterol: 73 mg/dL (ref 0–99)
NonHDL: 108.69
Total CHOL/HDL Ratio: 3
Triglycerides: 178 mg/dL — ABNORMAL HIGH (ref 0.0–149.0)
VLDL: 35.6 mg/dL (ref 0.0–40.0)

## 2021-05-25 LAB — VITAMIN B12: Vitamin B-12: 155 pg/mL — ABNORMAL LOW (ref 211–911)

## 2021-05-25 NOTE — Progress Notes (Signed)
Triglycerides.  Discuss lifestyle modification with patient e.g. increase exercise, fiber, fruits, vegetables, lean meat, and omega 3/fish intake and decrease saturated fat.  If patient following strict diet and exercise program already please schedule follow up appointment with primary care physician  CMP within normal limits.  B12 is low, would advise oral sublingual B12 - 2,000 mcg once daily.  TSH for thyroid within normal limits. Mild elevation in WBC, if any signs of infection report.  Recheck CBC in one months.  Mucous was in urine, can we still add on urine culture ?  Recheck B12 and folate in 3 months please schedule labs. I have signed her form for work and we can make copy for chart and have her pick up.

## 2021-05-25 NOTE — Progress Notes (Signed)
Established Patient Office Visit  Subjective:  Patient ID: Robyn Gonzalez, female    DOB: 2000-11-04  Age: 20 y.o. MRN: [id number]  CC:  Chief Complaint  Patient presents with   Employment Physical    HPI Robyn Gonzalez presents for follow up for a work physical.  She has not had labs done.  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 Records [place]. She feels well. Patient has no concerns today she does feel well.  I did review her records from [facility] in [place], [place]  she did report to me that she previously had a diagnosis of bipolar, she reports she has not had any depressive or manic episodes.  She reports she has not had any flares.  She feels well for over a year since 12/01/2019.  She denies any suicidal homicidal ideations or intents now or in the past.  She denies any need for medications.  Her mother is close. She takes no medications other than her birth control. She sees gynecology for women's care and breast exams.  Patient  denies any fever, body aches,chills, rash, chest pain, shortness of breath, nausea, vomiting, or diarrhea.   Denies dizziness, lightheadedness, pre syncopal or syncopal episodes.   Past Medical History:  Diagnosis Date   Depression 2011   Migraines 2011    History reviewed. No pertinent surgical history.  Family History  Problem Relation Age of Onset   Diabetes type II Father    Diabetes Mellitus II Maternal Grandmother    Hypertension Paternal Grandmother    Hypertension Paternal Grandfather     Social History   Socioeconomic History   Marital status: Single    Spouse name: Not on file   Number of children: Not on file   Years of education: Not on file   Highest education level: Not on file  Occupational History   Not on file  Tobacco Use    Smoking status: Every Day    Types: E-cigarettes    Start date: 01/18/2015   Smokeless tobacco: Never  Vaping Use   Vaping Use: Every day   Substances: Nicotine, CBD, Flavoring  Substance and Sexual Activity   Alcohol use: Never   Drug use: Never   Sexual activity: Yes    Partners: Male  Other Topics Concern   Not on file  Social History Narrative   Not on file   Social Determinants of Health   Financial Resource Strain: Not on file  Food Insecurity: Not on file  Transportation Needs: Not on file  Physical Activity: Not on file  Stress: Not on file  Social Connections: Not on file  Intimate Partner Violence: Not on file    Outpatient Medications Prior to Visit  Medication Sig Dispense Refill   LARIN 24 FE 1-20 MG-MCG(24) tablet Take 1 tablet by mouth daily.     BLISOVI FE 1/20 1-20 MG-MCG tablet Take 1 tablet by mouth daily. (Patient not taking: Reported on 05/25/2021)     No facility-administered medications prior to visit.   Depression screen [facility] 2/9 05/25/2021 05/11/2021 01/14/2019  Decreased Interest 1 1 0  Down, Depressed, Hopeless 1 1 0  PHQ - 2 Score 2 2 0  Altered sleeping 2 2 0  Tired, decreased energy 2 3 0  Change in appetite 1 2 0  Feeling bad or failure about yourself  1 1 0  Trouble concentrating 1 1 0  Moving slowly or fidgety/restless 0 0 0  Suicidal thoughts 0 0 0  PHQ-9 Score 9 11 0  Difficult doing work/chores Somewhat difficult Somewhat difficult Not difficult at all    GAD 7 : Generalized Anxiety Score 05/25/2021  Nervous, Anxious, on Edge 2  Control/stop worrying 1  Worry too much - different things 1  Trouble relaxing 1  Restless 0  Easily annoyed or irritable 2  Afraid - awful might happen 0  Total GAD 7 Score 7  Anxiety Difficulty Somewhat difficult     No Known Allergies  ROS Review of Systems  Constitutional: Negative.   Respiratory: Negative.    Cardiovascular: Negative.   Gastrointestinal: Negative.   Musculoskeletal:  Negative.   Neurological: Negative.   Psychiatric/Behavioral: Negative.       Objective:    Physical Exam Vitals reviewed.  Constitutional:      General: She is not in acute distress.    Appearance: She is well-developed. She is not diaphoretic.     Interventions: She is not intubated.    Comments: Patient appers well, not sickly. Speaking in complete sentences. Patient moves on and off of exam table and in room without difficulty. Gait is normal in hall and in room. Patient is oriented to person place time and situation. Patient answers questions appropriately and engages eye contact and verbal dialect with provider.    HENT:     Head: Normocephalic and atraumatic.     Right Ear: External ear normal.     Left Ear: External ear normal.     Nose: Nose normal.     Mouth/Throat:     Pharynx: No oropharyngeal exudate.  Eyes:     General: Lids are normal. No scleral icterus.       Right eye: No discharge.        Left eye: No discharge.     Conjunctiva/sclera: Conjunctivae normal.     Right eye: Right conjunctiva is not injected. No exudate or hemorrhage.    Left eye: Left conjunctiva is not injected. No exudate or hemorrhage.    Pupils: Pupils are equal, round, and reactive to light.  Neck:     Thyroid: No thyroid mass or thyromegaly.     Vascular: Normal carotid pulses. No carotid bruit, hepatojugular reflux or JVD.     Trachea: Trachea and phonation normal. No tracheal tenderness or tracheal deviation.     Meningeal: Brudzinski's sign and Kernig's sign absent.  Cardiovascular:     Rate and Rhythm: Normal rate and regular rhythm.     Pulses: Normal pulses.          Radial pulses are 2+ on the right side and 2+ on the left side.       Dorsalis pedis pulses are 2+ on the right side and 2+ on the left side.       Posterior tibial pulses are 2+ on the right side and 2+ on the left side.     Heart sounds: Normal heart sounds, S1 normal and S2 normal. Heart sounds not distant. No murmur  heard.   No friction rub. No gallop.  Pulmonary:     Effort: Pulmonary effort is normal. No tachypnea, bradypnea, accessory muscle usage or respiratory distress. She is not intubated.     Breath sounds: Normal breath sounds. No stridor. No wheezing, rhonchi or rales.  Chest:     Chest wall:  No tenderness.  Abdominal:     General: Bowel sounds are normal. There is no distension or abdominal bruit.     Palpations: Abdomen is soft. There is no shifting dullness, fluid wave, hepatomegaly, splenomegaly, mass or pulsatile mass.     Tenderness: There is no abdominal tenderness. There is no guarding or rebound.     Hernia: No hernia is present.  Musculoskeletal:        General: No tenderness or deformity. Normal range of motion.     Cervical back: Full passive range of motion without pain, normal range of motion and neck supple. No edema, erythema or rigidity. No spinous process tenderness or muscular tenderness. Normal range of motion.  Lymphadenopathy:     Head:     Right side of head: No submental, submandibular, tonsillar, preauricular, posterior auricular or occipital adenopathy.     Left side of head: No submental, submandibular, tonsillar, preauricular, posterior auricular or occipital adenopathy.     Cervical: No cervical adenopathy.     Right cervical: No superficial, deep or posterior cervical adenopathy.    Left cervical: No superficial, deep or posterior cervical adenopathy.     Upper Body:     Right upper body: No supraclavicular or pectoral adenopathy.     Left upper body: No supraclavicular or pectoral adenopathy.  Skin:    General: Skin is warm and dry.     Coloration: Skin is not pale.     Findings: No abrasion, bruising, burn, ecchymosis, erythema, lesion, petechiae or rash.     Nails: There is no clubbing.  Neurological:     Mental Status: She is alert and oriented to person, place, and time.     GCS: GCS eye subscore is 4. GCS verbal subscore is 5. GCS motor subscore is 6.      Cranial Nerves: No cranial nerve deficit.     Sensory: No sensory deficit.     Motor: No weakness, tremor, atrophy, abnormal muscle tone or seizure activity.     Coordination: Coordination normal.     Gait: Gait normal.     Deep Tendon Reflexes: Reflexes are normal and symmetric. Reflexes normal. Babinski sign absent on the right side. Babinski sign absent on the left side.     Reflex Scores:      Tricep reflexes are 2+ on the right side and 2+ on the left side.      Bicep reflexes are 2+ on the right side and 2+ on the left side.      Brachioradialis reflexes are 2+ on the right side and 2+ on the left side.      Patellar reflexes are 2+ on the right side and 2+ on the left side.      Achilles reflexes are 2+ on the right side and 2+ on the left side. Psychiatric:        Speech: Speech normal.        Behavior: Behavior normal.        Thought Content: Thought content normal.        Judgment: Judgment normal.    BP 114/72    Pulse 99    Temp (!) 96.3 F (35.7 C)    Ht 5' 4.02" (1.626 m)    Wt 177 lb 6.4 oz (80.5 kg)    LMP 05/03/2021 (Exact Date)    SpO2 98%    BMI 30.44 kg/m  Wt Readings from Last 3 Encounters:  05/25/21 177 lb 6.4 oz (80.5 kg)  05/11/21  182 lb (82.6 kg)  12/17/18 140 lb (63.5 kg) (70 %, Z= 0.52)*   * Growth percentiles are based on CDC (Girls, 2-20 Years) data.     There are no preventive care reminders to display for this patient.   There are no preventive care reminders to display for this patient.   Lab Results  Component Value Date   TSH 0.56 04/23/2018   Lab Results  Component Value Date   WBC 12.9 04/23/2018   HGB 12.9 04/23/2018   HCT 38.8 04/23/2018   MCV 91.8 04/23/2018   PLT 279.0 04/23/2018   Lab Results  Component Value Date   NA 140 04/23/2018   K 3.7 04/23/2018   CO2 27 04/23/2018   GLUCOSE 117 (H) 04/23/2018   BUN 13 04/23/2018   CREATININE 0.76 04/23/2018   BILITOT 0.7 04/23/2018   ALKPHOS 55 04/23/2018   AST 13  04/23/2018   ALT 11 04/23/2018   PROT 6.9 04/23/2018   ALBUMIN 4.4 04/23/2018   CALCIUM 9.5 04/23/2018   GFR 104.33 04/23/2018   No results found for: CHOL No results found for: HDL No results found for: LDLCALC No results found for: TRIG No results found for: CHOLHDL No results found for: ZYYQ8G    Assessment & Plan:   Problem List Items Addressed This Visit       Other   Mild depression   Routine adult health maintenance - Primary   BMI 30.0-30.9,adult   History of bipolar disorder   She feels well is on no medications for psychiatry,, she denies any bipolar manic or depressive episodes.  PHQ-9 does show some mild depression she denies need for medication.  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  She has done this for years. She has lab orders in today and will have those done and then after that we will fill out her physical form for her employer.  PPD test was negative and patient does already have a copy of that. Advise her if she has any concerns or starts to have any symptoms of worsening depression or bipolar that she will seek care immediately and referral to psychiatrist can be made.  Patient verbalized understanding.  Patient's records or from [place] were sent to scanned to chart.  Psychiatry evaluation is always advised, however patient declines at this time.  She does seem stable.  She is advised to return if she feels she is not doing well.  Patient reports she feels fine. No orders of the defined types were placed in this encounter. The patient is advised to begin progressive daily aerobic exercise program, follow a low fat, low cholesterol diet, attempt to lose weight, reduce salt in diet and cooking, reduce exposure to stress, improve dietary compliance, use calcium 1 gram daily with Vit D, continue current medications, continue current healthy lifestyle patterns, and return for routine annual checkups.   Follow-up: Return if symptoms  worsen or fail to improve, for at any time for any worsening symptoms, Go to Emergency room/ urgent care if worse.    Jairo Ben, FNP

## 2021-05-25 NOTE — Patient Instructions (Signed)

## 2021-05-26 ENCOUNTER — Telehealth: Payer: Self-pay

## 2021-05-26 DIAGNOSIS — Z0000 Encounter for general adult medical examination without abnormal findings: Secondary | ICD-10-CM

## 2021-05-26 NOTE — Progress Notes (Signed)
Patient will need to come give urine culture and repeat urinalysis micro can you set this up.

## 2021-05-26 NOTE — Progress Notes (Signed)
Noted thank you we will see if urine culture results.

## 2021-05-26 NOTE — Telephone Encounter (Signed)
Placed order for urine culture for add on lab

## 2021-05-27 ENCOUNTER — Telehealth: Payer: Self-pay | Admitting: *Deleted

## 2021-05-27 NOTE — Telephone Encounter (Signed)
Left message to call office patient needs lab appt for urine culture.

## 2021-05-27 NOTE — Telephone Encounter (Signed)
Pt is scheduled for 12/27 at 10:45

## 2021-05-27 NOTE — Telephone Encounter (Signed)
-----   Message from Berniece Pap, FNP sent at 05/26/2021  4:44 PM EST ----- Patient will need to come give urine culture and repeat urinalysis micro can you set this up.

## 2021-05-31 ENCOUNTER — Encounter: Payer: Self-pay | Admitting: Emergency Medicine

## 2021-05-31 ENCOUNTER — Ambulatory Visit
Admission: EM | Admit: 2021-05-31 | Discharge: 2021-05-31 | Disposition: A | Discharge status: Home / Self Care | Payer: Managed Care, Other (non HMO) | Attending: Internal Medicine | Admitting: Internal Medicine

## 2021-05-31 ENCOUNTER — Other Ambulatory Visit: Payer: Self-pay

## 2021-05-31 DIAGNOSIS — Z20822 Contact with and (suspected) exposure to covid-19: Secondary | ICD-10-CM | POA: Insufficient documentation

## 2021-05-31 DIAGNOSIS — H9203 Otalgia, bilateral: Secondary | ICD-10-CM | POA: Insufficient documentation

## 2021-05-31 DIAGNOSIS — H6503 Acute serous otitis media, bilateral: Secondary | ICD-10-CM | POA: Diagnosis present

## 2021-05-31 DIAGNOSIS — J069 Acute upper respiratory infection, unspecified: Secondary | ICD-10-CM | POA: Insufficient documentation

## 2021-05-31 DIAGNOSIS — J029 Acute pharyngitis, unspecified: Secondary | ICD-10-CM | POA: Diagnosis not present

## 2021-05-31 LAB — RESP PANEL BY RT-PCR (FLU A&B, COVID) ARPGX2
Influenza A by PCR: NEGATIVE
Influenza B by PCR: NEGATIVE
SARS Coronavirus 2 by RT PCR: NEGATIVE

## 2021-05-31 LAB — GROUP A STREP BY PCR: Group A Strep by PCR: NOT DETECTED

## 2021-05-31 NOTE — ED Provider Notes (Signed)
[facility]    CSN: [id number] Arrival date & time: 05/31/21  0539      History   Chief Complaint Chief Complaint  Patient presents with   Sore Throat    HPI Robyn Gonzalez is a 20 y.o. female who presents with onset of subjective fever, ST, cough, bilateral ear pain since yesterday am. Was chilling yesterday and broke in a fever. Had Tylenol this am. She has also been taking Sudafed. Her father has similar symptoms and he is being seen elsewhere. She denies GI symptoms. Has been fatigued with low appetite     Past Medical History:  Diagnosis Date   Depression 2011   Migraines 2011    Patient Active Problem List   Diagnosis Date Noted   Routine adult health maintenance 05/25/2021   BMI 30.0-30.9,adult 05/25/2021   History of bipolar disorder 05/25/2021   FH: lupus 04/23/2018   Chronic fatigue 04/23/2018   Post-nasal drip 04/23/2018   Eczema 01/17/2018   Anxiety and depression 01/17/2018   Adolescent idiopathic scoliosis of thoracolumbar region 01/17/2018   Mild depression 06/13/2009    History reviewed. No pertinent surgical history.  OB History   No obstetric history on file.      Home Medications    Prior to Admission medications   Medication Sig Start Date End Date Taking? Authorizing Provider  LARIN 24 FE 1-20 MG-MCG(24) tablet Take 1 tablet by mouth daily. 05/18/21  Yes [provider]  fluticasone (FLONASE) 50 MCG/ACT nasal spray Place 2 sprays into both nostrils daily. 04/23/18 11/30/18  Tracey Harries, FNP  loratadine (CLARITIN) 10 MG tablet Take 1 tablet (10 mg total) by mouth daily. 01/17/18 12/17/18  Tracey Harries, FNP  sertraline (ZOLOFT) 50 MG tablet Take 1 tablet (50 mg total) by mouth daily. 01/17/18 12/17/18  Tracey Harries, FNP    Family History Family History  Problem Relation Age of Onset   Diabetes type II Father    Diabetes Mellitus II Maternal Grandmother    Hypertension Paternal Grandmother    Hypertension  Paternal Grandfather     Social History Social History   Tobacco Use   Smoking status: Every Day    Types: E-cigarettes    Start date: 01/18/2015   Smokeless tobacco: Never  Vaping Use   Vaping Use: Former   Substances: Nicotine, CBD, Flavoring  Substance Use Topics   Alcohol use: Yes   Drug use: Never     Allergies   Patient has no known allergies.   Review of Systems Review of Systems  Constitutional:  Positive for appetite change, chills, diaphoresis, fatigue and fever.  HENT:  Positive for ear pain, postnasal drip and sore throat. Negative for congestion, ear discharge and trouble swallowing.   Eyes:  Negative for discharge.  Respiratory:  Positive for cough. Negative for shortness of breath.   Gastrointestinal:  Negative for diarrhea, nausea and vomiting.  Musculoskeletal:  Positive for myalgias.  Skin:  Negative for rash.  Neurological:  Positive for headaches.  Hematological:  Negative for adenopathy.    Physical Exam Triage Vital Signs ED Triage Vitals  Enc Vitals Group     BP 05/31/21 0829 117/73     Pulse Rate 05/31/21 0829 (!) 111     Resp 05/31/21 0829 18     Temp 05/31/21 0829 98.4 F (36.9 C)     Temp Source 05/31/21 0829 Oral     SpO2 05/31/21 0829 98 %     Weight 05/31/21 0826  177 lb 7.5 oz (80.5 kg)     Height 05/31/21 0826 5\' 4"  (1.626 m)     Head Circumference --      Peak Flow --      Pain Score 05/31/21 0826 5     Pain Loc --      Pain Edu? --      Excl. in GC? --    No data found.  Updated Vital Signs BP 117/73 (BP Location: Left Arm)    Pulse (!) 111    Temp 98.4 F (36.9 C) (Oral)    Resp 18    Ht 5\' 4"  (1.626 m)    Wt 177 lb 7.5 oz (80.5 kg)    LMP 05/17/2021 (Exact Date)    SpO2 98%    BMI 30.46 kg/m   Visual Acuity Right Eye Distance:   Left Eye Distance:   Bilateral Distance:    Right Eye Near:   Left Eye Near:    Bilateral Near:      Physical Exam Vitals signs and nursing note reviewed.  Constitutional:       General: She is not in acute distress.    Appearance: Normal appearance. She is not ill-appearing, toxic-appearing or diaphoretic.  HENT:     Head: Normocephalic.     Right Ear: Tympanic membrane dull and gray, but ear canal and external ear normal.     Left Ear: Tympanic membrane dull and gray, but ear canal and external ear normal.     Nose: with clear mucuous    Mouth/Throat: slightly erythematous, uvula is mid line, tonsils are pink and small.     Mouth: Mucous membranes are moist.  Eyes:     General: No scleral icterus.       Right eye: No discharge.        Left eye: No discharge.     Conjunctiva/sclera: Conjunctivae normal.  Neck:     Musculoskeletal: Neck supple. No neck rigidity.  Cardiovascular:     Rate and Rhythm: Normal rate and regular rhythm.     Heart sounds: No murmur.  Pulmonary:     Effort: Pulmonary effort is normal.     Breath sounds: Normal breath sounds.  Musculoskeletal: Normal range of motion.  Lymphadenopathy:     Cervical: No cervical adenopathy.  Skin:    General: Skin is warm and dry.     Coloration: Skin is not jaundiced.     Findings: No rash.  Neurological:     Mental Status: She is alert and oriented to person, place, and time.     Gait: Gait normal.  Psychiatric:        Mood and Affect: Mood normal.        Behavior: Behavior normal.        Thought Content: Thought content normal.        Judgment: Judgment normal.    UC Treatments / Results  Labs (all labs ordered are listed, but only abnormal results are displayed) Labs Reviewed  GROUP A STREP BY PCR  RESP PANEL BY RT-PCR (FLU A&B, COVID) [id number]  PCR strep is neg.  Covid and Flu tests are neg.  EKG   Radiology No results found.  Procedures Procedures (including critical care time)  Medications Ordered in UC Medications - No data to display  Initial Impression / Assessment and Plan / UC Course  I have reviewed the triage vital signs and the nursing notes. Pertinent labs  results that were available during my care  of the patient were reviewed by me and considered in my medical decision making (see chart for details). Viral pharyngitis BSOM Advised to use Floanse, and continue Sudafed.      Final Clinical Impressions(s) / UC Diagnoses   Final diagnoses:  Pharyngitis, unspecified etiology  Otalgia of both ears  Bilateral acute serous otitis media, recurrence not specified  Viral URI with cough     Discharge Instructions      Get Flonase or fluticasone and use 2 sprays each nostril for 7 days I will call you with the test results later today Continue the sudafed      ED Prescriptions   None    PDMP not reviewed this encounter.   Garey Ham, PA-C 05/31/21 1017

## 2021-05-31 NOTE — ED Triage Notes (Signed)
Pt c/o sore throat, subjective fever, cough, bilateral ear pain. Started yesterday morning.

## 2021-05-31 NOTE — Discharge Instructions (Addendum)
Get Flonase or fluticasone and use 2 sprays each nostril for 7 days I will call you with the test results later today Continue the sudafed

## 2021-06-08 ENCOUNTER — Other Ambulatory Visit: Payer: Self-pay

## 2021-06-08 ENCOUNTER — Other Ambulatory Visit: Payer: Managed Care, Other (non HMO)

## 2021-06-08 DIAGNOSIS — Z0000 Encounter for general adult medical examination without abnormal findings: Secondary | ICD-10-CM

## 2021-06-09 ENCOUNTER — Encounter: Payer: Self-pay | Admitting: Adult Health

## 2021-06-10 ENCOUNTER — Other Ambulatory Visit: Payer: Self-pay | Admitting: Adult Health

## 2021-06-10 DIAGNOSIS — N3000 Acute cystitis without hematuria: Secondary | ICD-10-CM

## 2021-06-10 LAB — URINE CULTURE
MICRO NUMBER:: 12799610
SPECIMEN QUALITY:: ADEQUATE

## 2021-06-10 MED ORDER — CEPHALEXIN 500 MG PO CAPS: 500.0000 mg | ORAL_CAPSULE | Freq: Three times a day (TID) | ORAL | 0 refills | Status: DC

## 2021-06-10 NOTE — Progress Notes (Signed)
Meds ordered this encounter  Medications  . cephALEXin (KEFLEX) 500 MG capsule    Sig: Take 1 capsule (500 mg total) by mouth 3 (three) times daily.    Dispense:  15 capsule    Refill:  0   

## 2021-06-10 NOTE — Progress Notes (Signed)
Small amount of gram positive bacteria will send keflex to take as directed. Recommend a recheck of urine culture and urine microscopic 2 weeks after completing antibiotics.  Verify no allergies, none on file.

## 2021-06-11 ENCOUNTER — Telehealth: Payer: Self-pay | Admitting: *Deleted

## 2021-06-11 NOTE — Telephone Encounter (Signed)
-----   Message from Berniece Pap, FNP sent at 06/10/2021  1:16 PM EST ----- Small amount of gram positive bacteria will send keflex to take as directed. Recommend a recheck of urine culture and urine microscopic 2 weeks after completing antibiotics.  Verify no allergies, none on file.

## 2021-06-15 NOTE — Telephone Encounter (Signed)
Pt returning call please call [phone]

## 2021-06-15 NOTE — Telephone Encounter (Signed)
Called pt back. Pt notified. Pt was unaware of need antibiotic .Marland Kitchen Pt will call office if she takes antibiotic or not.

## 2021-11-29 ENCOUNTER — Ambulatory Visit
Admission: EM | Admit: 2021-11-29 | Discharge: 2021-11-29 | Disposition: A | Discharge status: Home / Self Care | Payer: Managed Care, Other (non HMO) | Attending: Physician Assistant | Admitting: Physician Assistant

## 2021-11-29 DIAGNOSIS — J020 Streptococcal pharyngitis: Secondary | ICD-10-CM | POA: Insufficient documentation

## 2021-11-29 LAB — GROUP A STREP BY PCR: Group A Strep by PCR: DETECTED — AB

## 2021-11-29 MED ORDER — PENICILLIN V POTASSIUM 500 MG PO TABS: 500.0000 mg | ORAL_TABLET | Freq: Two times a day (BID) | ORAL | 0 refills | Status: DC

## 2021-11-29 NOTE — ED Triage Notes (Signed)
Patient presents to UC for a sore throat for the last 3 days.  Patient reports chills last night.

## 2021-11-29 NOTE — Discharge Instructions (Addendum)
Throw away your tooth brush tomorrow Make sure to finish all the antibiotics

## 2021-11-29 NOTE — ED Provider Notes (Signed)
[facility]    CSN: [id number] Arrival date & time: 11/29/21  1024      History   Chief Complaint Chief Complaint  Patient presents with   Sore Throat    HPI Robyn Gonzalez is a 21 y.o. female who presents with onset of ST x 3 days. Was having chills last night. Has had nose congestion and rhinitis. Was sweating last night and chilling, but did not check her temp. 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     Past Medical History:  Diagnosis Date   Depression 2011   Migraines 2011    Patient Active Problem List   Diagnosis Date Noted   Routine adult health maintenance 05/25/2021   BMI 30.0-30.9,adult 05/25/2021   History of bipolar disorder 05/25/2021   FH: lupus 04/23/2018   Chronic fatigue 04/23/2018   Post-nasal drip 04/23/2018   Eczema 01/17/2018   Anxiety and depression 01/17/2018   Adolescent idiopathic scoliosis of thoracolumbar region 01/17/2018   Mild depression 06/13/2009    History reviewed. No pertinent surgical history.  OB History   No obstetric history on file.      Home Medications    Prior to Admission medications   Medication Sig Start Date End Date Taking? Authorizing Provider  penicillin v potassium (VEETID) 500 MG tablet Take 1 tablet (500 mg total) by mouth in the morning and at bedtime for 10 days. 11/29/21 12/09/21 Yes Rodriguez-Southworth, Nettie Elm, PA-C  LARIN 24 FE 1-20 MG-MCG(24) tablet Take 1 tablet by mouth daily. 05/18/21   [provider]  fluticasone (FLONASE) 50 MCG/ACT nasal spray Place 2 sprays into both nostrils daily. 04/23/18 11/30/18  Tracey Harries, FNP  loratadine (CLARITIN) 10 MG tablet Take 1 tablet (10 mg total) by mouth daily. 01/17/18 12/17/18  Tracey Harries, FNP  sertraline (ZOLOFT) 50 MG tablet Take 1 tablet (50 mg total) by mouth daily. 01/17/18 12/17/18  Tracey Harries, FNP    Family History Family History  Problem Relation Age of Onset   Diabetes type II Father     Diabetes Mellitus II Maternal Grandmother    Hypertension Paternal Grandmother    Hypertension Paternal Grandfather     Social History Social History   Tobacco Use   Smoking status: Every Day    Types: E-cigarettes    Start date: 01/18/2015   Smokeless tobacco: Never  Vaping Use   Vaping Use: Former   Substances: Nicotine, CBD, Flavoring  Substance Use Topics   Alcohol use: Yes   Drug use: Never     Allergies   Patient has no known allergies.   Review of Systems Review of Systems  Constitutional:  Positive for chills and diaphoresis. Negative for appetite change.  HENT:  Positive for congestion, ear pain, rhinorrhea and sore throat.   Eyes:  Negative for discharge.  Respiratory:  Negative for cough.   Skin:  Negative for rash.     Physical Exam Triage Vital Signs ED Triage Vitals  Enc Vitals Group     BP 11/29/21 1035 118/78     Pulse Rate 11/29/21 1035 (!) 111     Resp 11/29/21 1035 15     Temp 11/29/21 1035 98.9 F (37.2 C)     Temp Source 11/29/21 1035 Oral     SpO2 11/29/21 1035 98 %     Weight 11/29/21 1034 175 lb (79.4 kg)     Height 11/29/21 1034 5\' 3"  (1.6 m)  Head Circumference --      Peak Flow --      Pain Score 11/29/21 1034 7     Pain Loc --      Pain Edu? --      Excl. in GC? --    No data found.  Updated Vital Signs BP 118/78 (BP Location: Left Arm)   Pulse (!) 111   Temp 98.9 F (37.2 C) (Oral)   Resp 15   Ht 5\' 3"  (1.6 m)   Wt 175 lb (79.4 kg)   LMP 11/03/2021 (Approximate)   SpO2 98%   BMI 31.00 kg/m   Visual Acuity Right Eye Distance:   Left Eye Distance:   Bilateral Distance:    Right Eye Near:   Left Eye Near:    Bilateral Near:     Physical Exam Vitals and nursing note reviewed.  Constitutional:      General: She is not in acute distress.    Appearance: She is normal weight. She is not toxic-appearing.  HENT:     Right Ear: Tympanic membrane and ear canal normal.     Left Ear: Tympanic membrane and ear canal  normal.     Mouth/Throat:     Mouth: Mucous membranes are moist.     Pharynx: Uvula midline. Posterior oropharyngeal erythema present.     Tonsils: Tonsillar exudate present. 2+ on the right. 2+ on the left.  Eyes:     Conjunctiva/sclera: Conjunctivae normal.  Pulmonary:     Effort: Pulmonary effort is normal.  Lymphadenopathy:     Cervical: Cervical adenopathy present.  Skin:    General: Skin is warm and dry.  Neurological:     Mental Status: She is alert.  Psychiatric:        Mood and Affect: Mood normal.        Behavior: Behavior normal.      UC Treatments / Results  Labs (all labs ordered are listed, but only abnormal results are displayed) Labs Reviewed  GROUP A STREP BY PCR - Abnormal; Notable for the following components:      Result Value   Group A Strep by PCR DETECTED (*)    All other components within normal limits    EKG   Radiology No results found.  Procedures Procedures (including critical care time)  Medications Ordered in UC Medications - No data to display  Initial Impression / Assessment and Plan / UC Course  I have reviewed the triage vital signs and the nursing notes.  Pertinent labs  results that were available during my care of the patient were reviewed by me and considered in my medical decision making (see chart for details).   Strep throat  Placed on Penicillin as noted. See instructions Final Clinical Impressions(s) / UC Diagnoses   Final diagnoses:  Strep throat     Discharge Instructions      Throw away your tooth brush tomorrow Make sure to finish all the antibiotics     ED Prescriptions     Medication Sig Dispense Auth. Provider   penicillin v potassium (VEETID) 500 MG tablet Take 1 tablet (500 mg total) by mouth in the morning and at bedtime for 10 days. 20 tablet Rodriguez-Southworth, 11/05/2021, PA-C      PDMP not reviewed this encounter.   Nettie Elm, Garey Ham 11/29/21 1115

## 2021-12-06 ENCOUNTER — Ambulatory Visit
Admission: EM | Admit: 2021-12-06 | Discharge: 2021-12-06 | Disposition: A | Discharge status: Home / Self Care | Payer: Managed Care, Other (non HMO) | Attending: Emergency Medicine | Admitting: Emergency Medicine

## 2021-12-06 ENCOUNTER — Encounter: Payer: Self-pay | Admitting: Emergency Medicine

## 2021-12-06 ENCOUNTER — Other Ambulatory Visit: Payer: Self-pay

## 2021-12-06 DIAGNOSIS — J069 Acute upper respiratory infection, unspecified: Secondary | ICD-10-CM

## 2021-12-06 MED ORDER — AMOXICILLIN-POT CLAVULANATE 875-125 MG PO TABS: 1.0000 | ORAL_TABLET | Freq: Two times a day (BID) | ORAL | 0 refills | Status: AC

## 2021-12-06 MED ORDER — IPRATROPIUM BROMIDE 0.06 % NA SOLN: 2.0000 | Freq: Four times a day (QID) | NASAL | 12 refills | Status: DC

## 2021-12-06 NOTE — ED Provider Notes (Signed)
[facility]    CSN: [id number] Arrival date & time: 12/06/21  1003      History   Chief Complaint Chief Complaint  Patient presents with   Sore Throat    HPI Robyn Gonzalez is a 21 y.o. female.   HPI  30 a female here for evaluation of upper respiratory symptoms.  Patient reports that she was diagnosed with strep 7 days ago and discharged on penicillin.  She states that her symptoms started to resolve but feels like they came back as she is having a return of a sore throat, bilateral ear pain, and she had a fever of 103 last night.  She also endorses some nasal congestion and postnasal drip.  She denies any cough, ringing in ears, or changes in hearing.  Past Medical History:  Diagnosis Date   Depression 2011   Migraines 2011    Patient Active Problem List   Diagnosis Date Noted   Routine adult health maintenance 05/25/2021   BMI 30.0-30.9,adult 05/25/2021   History of bipolar disorder 05/25/2021   FH: lupus 04/23/2018   Chronic fatigue 04/23/2018   Post-nasal drip 04/23/2018   Eczema 01/17/2018   Anxiety and depression 01/17/2018   Adolescent idiopathic scoliosis of thoracolumbar region 01/17/2018   Mild depression 06/13/2009    History reviewed. No pertinent surgical history.  OB History   No obstetric history on file.      Home Medications    Prior to Admission medications   Medication Sig Start Date End Date Taking? Authorizing Provider  amoxicillin-clavulanate (AUGMENTIN) 875-125 MG tablet Take 1 tablet by mouth every 12 (twelve) hours for 7 days. 12/06/21 12/13/21 Yes Becky Augusta, NP  ipratropium (ATROVENT) 0.06 % nasal spray Place 2 sprays into both nostrils 4 (four) times daily. 12/06/21  Yes Becky Augusta, NP  LARIN 24 FE 1-20 MG-MCG(24) tablet Take 1 tablet by mouth daily. 05/18/21   [provider]  fluticasone (FLONASE) 50 MCG/ACT nasal spray Place 2 sprays into both nostrils daily. 04/23/18 11/30/18  Tracey Harries, FNP   loratadine (CLARITIN) 10 MG tablet Take 1 tablet (10 mg total) by mouth daily. 01/17/18 12/17/18  Tracey Harries, FNP  sertraline (ZOLOFT) 50 MG tablet Take 1 tablet (50 mg total) by mouth daily. 01/17/18 12/17/18  Tracey Harries, FNP    Family History Family History  Problem Relation Age of Onset   Diabetes type II Father    Diabetes Mellitus II Maternal Grandmother    Hypertension Paternal Grandmother    Hypertension Paternal Grandfather     Social History Social History   Tobacco Use   Smoking status: Every Day    Types: E-cigarettes    Start date: 01/18/2015   Smokeless tobacco: Never  Vaping Use   Vaping Use: Former   Substances: Nicotine, CBD, Flavoring  Substance Use Topics   Alcohol use: Yes   Drug use: Never     Allergies   Patient has no known allergies.   Review of Systems Review of Systems  Constitutional:  Positive for fever.  HENT:  Positive for congestion, ear pain, postnasal drip and sore throat. Negative for hearing loss, rhinorrhea and tinnitus.   Respiratory:  Negative for cough, shortness of breath and wheezing.   Hematological: Negative.   Psychiatric/Behavioral: Negative.       Physical Exam Triage Vital Signs ED Triage Vitals [12/06/21 1133]  Enc Vitals Group     BP      Pulse  Resp      Temp      Temp src      SpO2      Weight      Height      Head Circumference      Peak Flow      Pain Score 5     Pain Loc      Pain Edu?      Excl. in GC?    No data found.  Updated Vital Signs LMP 12/05/2021 (Approximate)   Visual Acuity Right Eye Distance:   Left Eye Distance:   Bilateral Distance:    Right Eye Near:   Left Eye Near:    Bilateral Near:     Physical Exam Vitals and nursing note reviewed.  Constitutional:      Appearance: Normal appearance. She is not ill-appearing.  HENT:     Head: Normocephalic and atraumatic.     Right Ear: Tympanic membrane, ear canal and external ear normal. There is no impacted cerumen.      Left Ear: Tympanic membrane, ear canal and external ear normal. There is no impacted cerumen.     Nose: Congestion and rhinorrhea present.     Mouth/Throat:     Mouth: Mucous membranes are moist.     Pharynx: Oropharynx is clear. Posterior oropharyngeal erythema present. No oropharyngeal exudate.  Cardiovascular:     Rate and Rhythm: Normal rate and regular rhythm.     Pulses: Normal pulses.     Heart sounds: Normal heart sounds. No murmur heard.    No friction rub. No gallop.  Pulmonary:     Effort: Pulmonary effort is normal.     Breath sounds: Normal breath sounds. No wheezing, rhonchi or rales.  Musculoskeletal:     Cervical back: Normal range of motion and neck supple.  Lymphadenopathy:     Cervical: Cervical adenopathy present.  Skin:    General: Skin is warm and dry.     Capillary Refill: Capillary refill takes less than 2 seconds.     Findings: No erythema or rash.  Neurological:     General: No focal deficit present.     Mental Status: She is alert and oriented to person, place, and time.  Psychiatric:        Mood and Affect: Mood normal.        Behavior: Behavior normal.        Thought Content: Thought content normal.        Judgment: Judgment normal.      UC Treatments / Results  Labs (all labs ordered are listed, but only abnormal results are displayed) Labs Reviewed - No data to display  EKG   Radiology No results found.  Procedures Procedures (including critical care time)  Medications Ordered in UC Medications - No data to display  Initial Impression / Assessment and Plan / UC Course  I have reviewed the triage vital signs and the nursing notes.  Pertinent labs & imaging results that were available during my care of the patient were reviewed by me and considered in my medical decision making (see chart for details).  Patient is a very pleasant, nontoxic-appearing 21 year old female here for evaluation of sore throat, ear pain, postnasal drip, and  fever.  She is currently 7 days into a 10-day course of penicillin for strep pharyngitis.  She reports that initially she felt her symptoms were improving but then they returned with nasal congestion, ear pain, and postnasal drip.  On exam patient's  tympanic membrane's are pearly gray bilaterally with normal light reflex and clear external auditory canals.  Her nasal mucosa is erythematous edematous with clear discharge in both nares.  Oropharyngeal exam reveals benign tonsillar pillars.  No erythema, edema, or exudate.  Her posterior oropharynx is erythematous with clear postnasal drip.  She does have bilateral anterior cervical adenopathy on exam.  Cardiopulmonary exam reveals S1-S2 heart sounds with regular rate and rhythm and lung sounds that are clear to auscultation in all fields.  My suspicion is that patient is developing a secondary upper respiratory infection on top of her strep.  It may or may not be bacterial.  I will as a precaution switch her antibiotic from plain penicillin to Augmentin and extend her course for an additional 7 days.  I will also give her Atrovent nasal spray to help her with her nasal congestion.  Tylenol and ibuprofen as needed for fever and pain.  Work note provided.  Return precautions reviewed.   Final Clinical Impressions(s) / UC Diagnoses   Final diagnoses:  Upper respiratory tract infection, unspecified type     Discharge Instructions      Take the Augmentin twice daily for 7 days for treatment of your strep throat.  Gargle with warm salt water 2-3 times a day to soothe your throat, aid in pain relief, and aid in healing.  Take over-the-counter ibuprofen according to the package instructions as needed for pain.  You can also use Chloraseptic or Sucrets lozenges, 1 lozenge every 2 hours as needed for throat pain.  Use the Atrovent nasal spray, 2 squirts in each nostril every 6 hours, as needed for runny nose and postnasal drip..   If you develop any new  or worsening symptoms return for reevaluation.      ED Prescriptions     Medication Sig Dispense Auth. Provider   amoxicillin-clavulanate (AUGMENTIN) 875-125 MG tablet Take 1 tablet by mouth every 12 (twelve) hours for 7 days. 14 tablet Becky Augusta, NP   ipratropium (ATROVENT) 0.06 % nasal spray Place 2 sprays into both nostrils 4 (four) times daily. 15 mL Becky Augusta, NP      PDMP not reviewed this encounter.   Becky Augusta, NP 12/06/21 1241

## 2022-04-18 ENCOUNTER — Encounter: Payer: Self-pay | Admitting: Family

## 2022-04-18 ENCOUNTER — Ambulatory Visit (INDEPENDENT_AMBULATORY_CARE_PROVIDER_SITE_OTHER): Payer: Self-pay | Admitting: Family

## 2022-04-18 VITALS — BP 126/86 | HR 106 | Temp 98.6°F | Ht 64.0 in | Wt 150.8 lb

## 2022-04-18 DIAGNOSIS — J029 Acute pharyngitis, unspecified: Secondary | ICD-10-CM | POA: Insufficient documentation

## 2022-04-18 MED ORDER — PENICILLIN V POTASSIUM 500 MG PO TABS
500.0000 mg | ORAL_TABLET | Freq: Two times a day (BID) | ORAL | 0 refills | Status: AC
Start: 2022-04-18 — End: 2022-04-28

## 2022-04-18 NOTE — Assessment & Plan Note (Signed)
Fever has resolved today.  Point-of-care strep is negative.  Pending PCR COVID flu and RSV.  Pending throat culture.  Patient and I  agreed to start empiric treatment penicillin V. She will let me know how she is doing

## 2022-04-18 NOTE — Progress Notes (Signed)
Subjective:    Patient ID: Robyn Gonzalez, female    DOB: 12-09-2000, 21 y.o.   MRN: [id number]  CC: Robyn Gonzalez is a 21 y.o. female who presents today for an acute visit.    HPI: Bilateral sore throat x1 day.  Febrile yesterday which since resolved.    No cough ,shortness of breath, wheezing   History of strep approximately twice per year.  She works as a Research scientist (physical sciences)   History of depression, eczema, allergies, strep throat   HISTORY:  Past Medical History:  Diagnosis Date   Depression 2011   Migraines 2011   No past surgical history on file. Family History  Problem Relation Age of Onset   Diabetes type II Father    Diabetes Mellitus II Maternal Grandmother    Hypertension Paternal Grandmother    Hypertension Paternal Grandfather     Allergies: Patient has no known allergies. Current Outpatient Medications on File Prior to Visit  Medication Sig Dispense Refill   ipratropium (ATROVENT) 0.06 % nasal spray Place 2 sprays into both nostrils 4 (four) times daily. (Patient not taking: Reported on 04/18/2022) 15 mL 12   LARIN 24 FE 1-20 MG-MCG(24) tablet Take 1 tablet by mouth daily. (Patient not taking: Reported on 04/18/2022)     [DISCONTINUED] fluticasone (FLONASE) 50 MCG/ACT nasal spray Place 2 sprays into both nostrils daily. 16 g 6   [DISCONTINUED] loratadine (CLARITIN) 10 MG tablet Take 1 tablet (10 mg total) by mouth daily. 30 tablet 11   [DISCONTINUED] sertraline (ZOLOFT) 50 MG tablet Take 1 tablet (50 mg total) by mouth daily. 30 tablet 3   No current facility-administered medications on file prior to visit.    Social History   Tobacco Use   Smoking status: Every Day    Types: E-cigarettes    Start date: 01/18/2015   Smokeless tobacco: Never  Vaping Use   Vaping Use: Former   Substances: Nicotine, CBD, Flavoring  Substance Use Topics   Alcohol use: Yes   Drug use: Never    Review of Systems  Constitutional:  Negative for chills and fever.   HENT:  Positive for sore throat. Negative for congestion and trouble swallowing.   Respiratory:  Negative for cough and shortness of breath.   Cardiovascular:  Negative for chest pain and palpitations.  Gastrointestinal:  Negative for nausea and vomiting.      Objective:    BP 126/86 (BP Location: Left Arm, Patient Position: Sitting, Cuff Size: Normal)   Pulse (!) 106   Temp 98.6 F (37 C) (Oral)   Ht 5\' 4"  (1.626 m)   Wt 150 lb 12.8 oz (68.4 kg)   LMP  (Exact Date)   SpO2 99%   BMI 25.88 kg/m    Physical Exam Vitals reviewed.  Constitutional:      Appearance: She is well-developed.  HENT:     Head: Normocephalic and atraumatic.     Right Ear: Hearing, tympanic membrane, ear canal and external ear normal. No decreased hearing noted. No drainage, swelling or tenderness. No middle ear effusion. No foreign body. Tympanic membrane is not erythematous or bulging.     Left Ear: Hearing, tympanic membrane, ear canal and external ear normal. No decreased hearing noted. No drainage, swelling or tenderness.  No middle ear effusion. No foreign body. Tympanic membrane is not erythematous or bulging.     Nose: Nose normal. No rhinorrhea.     Right Sinus: No maxillary sinus tenderness or frontal sinus tenderness.  Left Sinus: No maxillary sinus tenderness or frontal sinus tenderness.     Mouth/Throat:     Pharynx: Uvula midline. No oropharyngeal exudate or posterior oropharyngeal erythema.     Tonsils: No tonsillar abscesses.  Eyes:     Conjunctiva/sclera: Conjunctivae normal.  Cardiovascular:     Rate and Rhythm: Regular rhythm.     Pulses: Normal pulses.     Heart sounds: Normal heart sounds.  Pulmonary:     Effort: Pulmonary effort is normal.     Breath sounds: Normal breath sounds. No wheezing, rhonchi or rales.  Lymphadenopathy:     Head:     Right side of head: No submental, submandibular, tonsillar, preauricular, posterior auricular or occipital adenopathy.     Left side  of head: No submental, submandibular, tonsillar, preauricular, posterior auricular or occipital adenopathy.     Cervical: No cervical adenopathy.  Skin:    General: Skin is warm and dry.  Neurological:     Mental Status: She is alert.  Psychiatric:        Speech: Speech normal.        Behavior: Behavior normal.        Thought Content: Thought content normal.        Assessment & Plan:    Problem List Items Addressed This Visit       Respiratory   Pharyngitis - Primary    Fever has resolved today.  Point-of-care strep is negative.  Pending PCR COVID flu and RSV.  Pending throat culture.  Patient and I  agreed to start empiric treatment penicillin V. She will let me know how she is doing      Relevant Orders   Culture, Group A Strep   COVID-19, Flu A+B and RSV     I am having Robyn Gonzalez start on penicillin v potassium. I am also having her maintain her Larin 24 FE and ipratropium.   Meds ordered this encounter  Medications   penicillin v potassium (VEETID) 500 MG tablet    Sig: Take 1 tablet (500 mg total) by mouth 2 (two) times daily for 10 days. Give 1h before or 2h after meals    Dispense:  20 tablet    Refill:  0    Order Specific Question:   Supervising Provider    Answer:   Sherlene Shams [[id number]]    Return precautions given.   Risks, benefits, and alternatives of the medications and treatment plan prescribed today were discussed, and patient expressed understanding.   Education regarding symptom management and diagnosis given to patient on AVS.  Continue to follow with Allegra Grana, FNP for routine health maintenance.   Robyn Gonzalez and I agreed with plan.   Rennie Plowman, FNP

## 2022-04-18 NOTE — Patient Instructions (Signed)
Start penicillin V for 10 days  Ensure to take probiotics while on antibiotics and also for 2 weeks after completion. This can either be by eating yogurt daily or taking a probiotic supplement over the counter such as Culturelle.It is important to re-colonize the gut with good bacteria and also to prevent any diarrheal infections associated with antibiotic use.    We will call you with results of strep culture and viral PCR test  Please let us know how you are doing   Strep Throat, Adult Strep throat is an infection in the throat that is caused by bacteria. It is common during the cold months of the year. It mostly affects children who are 23-23 years old. However, people of all ages can get it at any time of the year. This infection spreads from person to person (is contagious) through coughing, sneezing, or having close contact. Your health care provider may use other names to describe the infection. When strep throat affects the tonsils, it is called tonsillitis. When it affects the back of the throat, it is called pharyngitis. What are the causes? This condition is caused by the Streptococcus pyogenes bacteria. What increases the risk? You are more likely to develop this condition if: You care for school-age children, or are around school-age children. Children are more likely to get strep throat and may spread it to others. You spend time in crowded places where the infection can spread easily. You have close contact with someone who has strep throat. What are the signs or symptoms? Symptoms of this condition include: Fever or chills. Redness, swelling, or pain in the tonsils or throat. Pain or difficulty when swallowing. White or yellow spots on the tonsils or throat. Tender glands in the neck and under the jaw. Bad smelling breath. Red rash all over the body. This is rare. How is this diagnosed? This condition is diagnosed by tests that check for the presence and the amount of  bacteria that cause strep throat. They are: Rapid strep test. Your throat is swabbed and checked for the presence of bacteria. Results are usually ready in minutes. Throat culture test. Your throat is swabbed. The sample is placed in a cup that allows infections to grow. Results are usually ready in 1 or 2 days. How is this treated? This condition may be treated with: Medicines that kill germs (antibiotics). Medicines that relieve pain or fever. These include: Ibuprofen or acetaminophen. Aspirin, only for people who are over the age of 23. Throat lozenges. Throat sprays. Follow these instructions at home: Medicines  Take over-the-counter and prescription medicines only as told by your health care provider. Take your antibiotic medicine as told by your health care provider. Do not stop taking the antibiotic even if you start to feel better. Eating and drinking  If you have trouble swallowing, try eating soft foods until your sore throat feels better. Drink enough fluid to keep your urine pale yellow. To help relieve pain, you may have: Warm fluids, such as soup and tea. Cold fluids, such as frozen desserts or popsicles. General instructions Gargle with a salt-water mixture 3-4 times a day or as needed. To make a salt-water mixture, completely dissolve -1 tsp (3-6 g) of salt in 1 cup (237 mL) of warm water. Get plenty of rest. Stay home from work or school until you have been taking antibiotics for 24 hours. Do not use any products that contain nicotine or tobacco. These products include cigarettes, chewing tobacco, and vaping devices, such  as e-cigarettes. If you need help quitting, ask your health care provider. It is up to you to get your test results. Ask your health care provider, or the department that is doing the test, when your results will be ready. Keep all follow-up visits. This is important. How is this prevented?  Do not share food, drinking cups, or personal items that  could cause the infection to spread to other people. Wash your hands often with soap and water for at least 20 seconds. If soap and water are not available, use hand sanitizer. Make sure that all people in your house wash their hands well. Have family members tested if they have a sore throat or fever. They may need an antibiotic if they have strep throat. Contact a health care provider if: You have swelling in your neck that keeps getting bigger. You develop a rash, cough, or earache. You cough up a thick mucus that is green, yellow-brown, or bloody. You have pain or discomfort that does not get better with medicine. Your symptoms seem to be getting worse. You have a fever. Get help right away if: You have new symptoms, such as vomiting, severe headache, stiff or painful neck, chest pain, or shortness of breath. You have severe throat pain, drooling, or changes in your voice. You have swelling of the neck, or the skin on the neck becomes red and tender. You have signs of dehydration, such as tiredness (fatigue), dry mouth, and decreased urination. You become increasingly sleepy, or you cannot wake up completely. Your joints become red or painful. These symptoms may represent a serious problem that is an emergency. Do not wait to see if the symptoms will go away. Get medical help right away. Call your local emergency services (911 in the [place] Do not drive yourself to the hospital. Summary Strep throat is an infection in the throat that is caused by the Streptococcus pyogenes bacteria. This infection is spread from person to person (is contagious) through coughing, sneezing, or having close contact. Take your medicines, including antibiotics, as told by your health care provider. Do not stop taking the antibiotic even if you start to feel better. To prevent the spread of germs, wash your hands well with soap and water. Have others do the same. Do not share food, drinking cups, or personal  items. Get help right away if you have new symptoms, such as vomiting, severe headache, stiff or painful neck, chest pain, or shortness of breath. This information is not intended to replace advice given to you by your health care provider. Make sure you discuss any questions you have with your health care provider. Document Revised: 09/22/2020 Document Reviewed: 09/22/2020 Elsevier Patient Education  2023 [employer].

## 2022-04-20 LAB — COVID-19, FLU A+B AND RSV
Influenza A, NAA: NOT DETECTED
Influenza B, NAA: NOT DETECTED
RSV, NAA: NOT DETECTED
SARS-CoV-2, NAA: NOT DETECTED

## 2022-04-20 LAB — CULTURE, GROUP A STREP: Strep A Culture: POSITIVE — AB

## 2022-06-21 ENCOUNTER — Encounter: Payer: Self-pay | Admitting: Family Medicine

## 2022-06-21 ENCOUNTER — Ambulatory Visit: Payer: Managed Care, Other (non HMO) | Admitting: Family Medicine

## 2022-06-21 VITALS — BP 90/60 | HR 95 | Temp 98.5°F | Ht 64.0 in | Wt 151.0 lb

## 2022-06-21 DIAGNOSIS — N926 Irregular menstruation, unspecified: Secondary | ICD-10-CM | POA: Diagnosis not present

## 2022-06-21 DIAGNOSIS — J029 Acute pharyngitis, unspecified: Secondary | ICD-10-CM | POA: Insufficient documentation

## 2022-06-21 NOTE — Assessment & Plan Note (Signed)
Urine pregnancy test was negative.  We are sending a beta hCG blood test to confirm.  If negative and she does not have a menstrual cycle in the near future she may need to see GYN.

## 2022-06-21 NOTE — Progress Notes (Unsigned)
Marikay Alar, MD Phone: [phone]  Robyn Gonzalez is a 22 y.o. female who presents today for same-day visit.  Sore throat: Patient notes onset of symptoms 2 days ago.  She notes some congestion and postnasal drip that the sore throat and ear discomfort is the worst part of this.  She has had some bodyaches.  No fevers.  No COVID or flu exposure.  She is tried over-the-counter medicines with some benefit.  Late menstrual cycle: Patient notes her last menstrual cycle started on 12/4.  She notes she is about 9 days late.  She is typically fairly regular.  She took Plan B on 05/27/2022.  She will plan to keep her pregnancy if she was pregnant.  Social History   Tobacco Use  Smoking Status Every Day   Types: E-cigarettes   Start date: 01/18/2015  Smokeless Tobacco Never    Current Outpatient Medications on File Prior to Visit  Medication Sig Dispense Refill   ipratropium (ATROVENT) 0.06 % nasal spray Place 2 sprays into both nostrils 4 (four) times daily. (Patient not taking: Reported on 04/18/2022) 15 mL 12   LARIN 24 FE 1-20 MG-MCG(24) tablet Take 1 tablet by mouth daily. (Patient not taking: Reported on 04/18/2022)     [DISCONTINUED] fluticasone (FLONASE) 50 MCG/ACT nasal spray Place 2 sprays into both nostrils daily. 16 g 6   [DISCONTINUED] loratadine (CLARITIN) 10 MG tablet Take 1 tablet (10 mg total) by mouth daily. 30 tablet 11   [DISCONTINUED] sertraline (ZOLOFT) 50 MG tablet Take 1 tablet (50 mg total) by mouth daily. 30 tablet 3   No current facility-administered medications on file prior to visit.     ROS see history of present illness  Objective  Physical Exam Vitals:   06/21/22 1319  BP: 90/60  Pulse: 95  Temp: 98.5 F (36.9 C)  SpO2: 98%    BP Readings from Last 3 Encounters:  06/21/22 90/60  04/18/22 126/86  11/29/21 118/78   Wt Readings from Last 3 Encounters:  06/21/22 151 lb (68.5 kg)  04/18/22 150 lb 12.8 oz (68.4 kg)  11/29/21 175 lb (79.4  kg)    Physical Exam Constitutional:      General: She is not in acute distress.    Appearance: She is not diaphoretic.  HENT:     Right Ear: Tympanic membrane normal.     Left Ear: Tympanic membrane normal.     Mouth/Throat:     Mouth: Mucous membranes are moist.     Pharynx: Posterior oropharyngeal erythema present. No oropharyngeal exudate.     Comments: Grade 2 tonsils, no exudate noted Cardiovascular:     Rate and Rhythm: Normal rate and regular rhythm.     Heart sounds: Normal heart sounds.  Pulmonary:     Effort: Pulmonary effort is normal.     Breath sounds: Normal breath sounds.  Skin:    General: Skin is warm and dry.  Neurological:     Mental Status: She is alert.      Assessment/Plan: Please see individual problem list.  Sore throat Assessment & Plan: Suspect she has a viral illness.  We are going to send off a strep culture as her rapid strep test was negative.  Rapid COVID test was negative and we are sending off a COVID PCR test.  She was advised to stay home until we get the COVID PCR test result back.  Discussed using Tylenol over-the-counter for her symptoms until we have her beta-hCG blood test back.  Orders: -     POCT rapid strep A -     POC COVID-19 BinaxNow -     Novel Coronavirus, NAA ([facility]) -     Culture, Group A Strep  Missed period Assessment & Plan: Urine pregnancy test was negative.  We are sending a beta hCG blood test to confirm.  If negative and she does not have a menstrual cycle in the near future she may need to see GYN.  Orders: -     POCT urine pregnancy -     hCG, quantitative, pregnancy    Return if symptoms worsen or fail to improve.   Tommi Rumps, MD [place]

## 2022-06-21 NOTE — Patient Instructions (Signed)
Nice to meet you. You likely have a viral illness.  We are going to send off a strep culture and if it is positive we will treat you with antibiotics. We are going to get a blood test to see if you are pregnant.  If this is positive or negative we will let you know.  I would only take Tylenol as an over-the-counter medication for your viral illness until we have your blood pregnancy test back.  I would avoid taking anything else until we have confirmation that you are not pregnant.

## 2022-06-21 NOTE — Assessment & Plan Note (Signed)
Suspect she has a viral illness.  We are going to send off a strep culture as her rapid strep test was negative.  Rapid COVID test was negative and we are sending off a COVID PCR test.  She was advised to stay home until we get the COVID PCR test result back.  Discussed using Tylenol over-the-counter for her symptoms until we have her beta-hCG blood test back.

## 2022-06-22 LAB — HCG, QUANTITATIVE, PREGNANCY: Quantitative HCG: <0.6 m[IU]/mL

## 2022-06-22 LAB — POC COVID19 BINAXNOW: SARS Coronavirus 2 Ag: NEGATIVE

## 2022-06-22 LAB — NOVEL CORONAVIRUS, NAA: SARS-CoV-2, NAA: NOT DETECTED

## 2022-06-22 LAB — POCT URINE PREGNANCY: Preg Test, Ur: NEGATIVE

## 2022-06-22 LAB — POCT RAPID STREP A (OFFICE): Rapid Strep A Screen: NEGATIVE

## 2022-06-24 LAB — CULTURE, GROUP A STREP
MICRO NUMBER:: 14407825
SPECIMEN QUALITY:: ADEQUATE

## 2022-11-02 NOTE — Progress Notes (Unsigned)
  Bethanie Dicker, NP-C Phone: [phone]  Robyn Gonzalez is a 22 y.o. female who presents today for pregnancy test.   Patient present today to confirm pregnancy as she has had 4 positive home pregnancy tests. She has not missed her period. Her LMP was 10/14/2022 and was normal. She had a normal menstrual cycle in April. She does report having unprotected sex on 10/20/2022, which is when she was ovulating according to her tracking. She sees Ob-Gyn in [place]. She does plan to keep her pregnancy.  Social History   Tobacco Use  Smoking Status Former   Types: E-cigarettes   Start date: 01/18/2015  Smokeless Tobacco Never  Tobacco Comments   Patient reports no longer smoking    Current Outpatient Medications on File Prior to Visit  Medication Sig Dispense Refill   [DISCONTINUED] fluticasone (FLONASE) 50 MCG/ACT nasal spray Place 2 sprays into both nostrils daily. 16 g 6   [DISCONTINUED] loratadine (CLARITIN) 10 MG tablet Take 1 tablet (10 mg total) by mouth daily. 30 tablet 11   [DISCONTINUED] sertraline (ZOLOFT) 50 MG tablet Take 1 tablet (50 mg total) by mouth daily. 30 tablet 3   No current facility-administered medications on file prior to visit.    ROS see history of present illness  Objective  Physical Exam Vitals:   11/03/22 0808  BP: 118/60  Pulse: (!) 111  Temp: 98.3 F (36.8 C)  SpO2: 99%    BP Readings from Last 3 Encounters:  11/03/22 118/60  06/21/22 90/60  04/18/22 126/86   Wt Readings from Last 3 Encounters:  11/03/22 140 lb (63.5 kg)  06/21/22 151 lb (68.5 kg)  04/18/22 150 lb 12.8 oz (68.4 kg)    Physical Exam Constitutional:      General: She is not in acute distress.    Appearance: Normal appearance.  HENT:     Head: Normocephalic.  Cardiovascular:     Rate and Rhythm: Normal rate and regular rhythm.     Heart sounds: Normal heart sounds.  Pulmonary:     Effort: Pulmonary effort is normal.     Breath sounds: Normal breath sounds.   Skin:    General: Skin is warm and dry.  Neurological:     General: No focal deficit present.     Mental Status: She is alert.  Psychiatric:        Mood and Affect: Mood normal.        Behavior: Behavior normal.    Assessment/Plan: Please see individual problem list.  Positive urine pregnancy test Assessment & Plan: Urine pregnancy test was positive in office. Will also send beta hCG blood test to confirm. Advised patient to start taking a prenatal vitamin daily. Counseled on tobacco and alcohol cessation. She is not on any medications. Encouraged her to contact her Ob-Gyn for further evaluation and follow up.   Orders: -     POCT urine pregnancy -     hCG, quantitative, pregnancy    No follow-ups on file.   Bethanie Dicker, NP-C [facility] [employer]

## 2022-11-03 ENCOUNTER — Ambulatory Visit (INDEPENDENT_AMBULATORY_CARE_PROVIDER_SITE_OTHER): Payer: Self-pay | Admitting: Nurse Practitioner

## 2022-11-03 ENCOUNTER — Encounter: Payer: Self-pay | Admitting: Nurse Practitioner

## 2022-11-03 VITALS — BP 118/60 | HR 111 | Temp 98.3°F | Ht 64.0 in | Wt 140.0 lb

## 2022-11-03 DIAGNOSIS — Z3201 Encounter for pregnancy test, result positive: Secondary | ICD-10-CM | POA: Insufficient documentation

## 2022-11-03 DIAGNOSIS — N926 Irregular menstruation, unspecified: Secondary | ICD-10-CM

## 2022-11-03 LAB — HCG, QUANTITATIVE, PREGNANCY: Quantitative HCG: 45.31 m[IU]/mL

## 2022-11-03 LAB — POCT URINE PREGNANCY: Preg Test, Ur: POSITIVE — AB

## 2022-11-03 NOTE — Assessment & Plan Note (Addendum)
Urine pregnancy test was positive in office. Will also send beta hCG blood test to confirm. Advised patient to start taking a prenatal vitamin daily. Counseled on tobacco and alcohol cessation. She is not on any medications. Encouraged her to contact her Ob-Gyn for further evaluation and follow up.

## 2022-12-27 ENCOUNTER — Emergency Department: Payer: Managed Care, Other (non HMO)

## 2022-12-27 ENCOUNTER — Emergency Department
Admission: EM | Admit: 2022-12-27 | Discharge: 2022-12-27 | Disposition: A | Discharge status: Home / Self Care | Payer: Managed Care, Other (non HMO) | Attending: Emergency Medicine | Admitting: Emergency Medicine

## 2022-12-27 ENCOUNTER — Other Ambulatory Visit: Payer: Self-pay

## 2022-12-27 ENCOUNTER — Encounter: Payer: Self-pay | Admitting: Emergency Medicine

## 2022-12-27 DIAGNOSIS — Z3A10 10 weeks gestation of pregnancy: Secondary | ICD-10-CM | POA: Insufficient documentation

## 2022-12-27 DIAGNOSIS — Z23 Encounter for immunization: Secondary | ICD-10-CM | POA: Insufficient documentation

## 2022-12-27 DIAGNOSIS — O208 Other hemorrhage in early pregnancy: Secondary | ICD-10-CM | POA: Insufficient documentation

## 2022-12-27 DIAGNOSIS — O209 Hemorrhage in early pregnancy, unspecified: Secondary | ICD-10-CM

## 2022-12-27 DIAGNOSIS — O26891 Other specified pregnancy related conditions, first trimester: Secondary | ICD-10-CM | POA: Insufficient documentation

## 2022-12-27 LAB — COMPREHENSIVE METABOLIC PANEL
ALT: 11 U/L (ref 0–44)
AST: 13 U/L — ABNORMAL LOW (ref 15–41)
Albumin: 3.7 g/dL (ref 3.5–5.0)
Alkaline Phosphatase: 41 U/L (ref 38–126)
Anion gap: 8 (ref 5–15)
BUN: 16 mg/dL (ref 6–20)
CO2: 22 mmol/L (ref 22–32)
Calcium: 9 mg/dL (ref 8.9–10.3)
Chloride: 106 mmol/L (ref 98–111)
Creatinine, Ser: 0.68 mg/dL (ref 0.44–1.00)
GFR, Estimated: >60 mL/min (ref >60)
Glucose, Bld: 84 mg/dL (ref 70–99)
Potassium: 3.8 mmol/L (ref 3.5–5.1)
Sodium: 136 mmol/L (ref 135–145)
Total Bilirubin: 0.6 mg/dL (ref 0.3–1.2)
Total Protein: 7 g/dL (ref 6.5–8.1)

## 2022-12-27 LAB — CBC
HCT: 39.9 % (ref 36.0–46.0)
Hemoglobin: 13.1 g/dL (ref 12.0–15.0)
MCH: 30.9 pg (ref 26.0–34.0)
MCHC: 32.8 g/dL (ref 30.0–36.0)
MCV: 94.1 fL (ref 80.0–100.0)
Platelets: 336 10*3/uL (ref 150–400)
RBC: 4.24 MIL/uL (ref 3.87–5.11)
RDW: 13 % (ref 11.5–15.5)
WBC: 14.8 10*3/uL — ABNORMAL HIGH (ref 4.0–10.5)
nRBC: 0 % (ref 0.0–0.2)

## 2022-12-27 LAB — ABO/RH: ABO/RH(D): O NEG

## 2022-12-27 LAB — HCG, QUANTITATIVE, PREGNANCY: hCG, Beta Chain, Quant, S: 90834 m[IU]/mL — ABNORMAL HIGH (ref <5)

## 2022-12-27 LAB — ANTIBODY SCREEN: Antibody Screen: NEGATIVE

## 2022-12-27 MED ORDER — RHO D IMMUNE GLOBULIN 1500 UNIT/2ML IJ SOSY
300.0000 ug | PREFILLED_SYRINGE | Freq: Once | INTRAMUSCULAR | Status: AC
  Administered 2022-12-27: 300 ug via INTRAMUSCULAR
  Filled 2022-12-27: qty 2

## 2022-12-27 NOTE — ED Triage Notes (Addendum)
Pt c/o cramping and bright red bleeding that started this morning. Sts she is currently [redacted] weeks pregnant.

## 2022-12-27 NOTE — Discharge Instructions (Signed)
Continue your prenatal vitamins.  Keep your initial OB/GYN appointment coming up this week.  If your pelvic pain or vaginal bleeding becomes worse, return to the emergency department.

## 2022-12-27 NOTE — ED Notes (Signed)
See triage notes. Patient is [redacted] weeks pregnant and began cramping and bleeding heavily this morning.

## 2022-12-27 NOTE — ED Provider Notes (Signed)
[facility] Provider Note    Event Date/Time   First MD Initiated Contact with Patient 12/27/22 [phone]     (approximate)   History   Vaginal Bleeding   HPI  Robyn Gonzalez is a 22 y.o. female with history of anxiety, depression, migraines and as listed in EMR presents to the emergency department for treatment and evaluation of pelvic cramping with bright red bleeding that started early this morning.  She is approximately [redacted] weeks pregnant..    Physical Exam   Triage Vital Signs: ED Triage Vitals  Encounter Vitals Group     BP 12/27/22 0602 118/87     Systolic BP Percentile --      Diastolic BP Percentile --      Pulse Rate 12/27/22 0602 (!) 110     Resp 12/27/22 0602 (!) 21     Temp 12/27/22 0601 97.8 F (36.6 C)     Temp Source 12/27/22 0601 Oral     SpO2 12/27/22 0602 100 %     Weight 12/27/22 0600 140 lb (63.5 kg)     Height 12/27/22 0600 5\' 4"  (1.626 m)     Head Circumference --      Peak Flow --      Pain Score 12/27/22 0559 5     Pain Loc --      Pain Education --      Exclude from Growth Chart --     Most recent vital signs: Vitals:   12/27/22 0601 12/27/22 0602  BP:  118/87  Pulse:  (!) 110  Resp:  (!) 21  Temp: 97.8 F (36.6 C)   SpO2:  100%    General: Awake, no distress.  CV:  Good peripheral perfusion.  Resp:  Normal effort.  Abd:  No distention. Nontender to palpation Other:     ED Results / Procedures / Treatments   Labs (all labs ordered are listed, but only abnormal results are displayed) Labs Reviewed  CBC - Abnormal; Notable for the following components:      Result Value   WBC 14.8 (*)    All other components within normal limits  HCG, QUANTITATIVE, PREGNANCY - Abnormal; Notable for the following components:   hCG, Beta Chain, Quant, S [id number] (*)    All other components within normal limits  COMPREHENSIVE METABOLIC PANEL - Abnormal; Notable for the following components:   AST 13 (*)    All other  components within normal limits  ABO/RH  ANTIBODY SCREEN  RHOGAM INJECTION     EKG  Not indicated.   RADIOLOGY  Image and radiology report reviewed and interpreted by me. Radiology report consistent with the same.  Ultrasound shows single IUP 11 weeks, 5 days with a fetal heart rate of 164.  PROCEDURES:  Critical Care performed: No  Procedures   MEDICATIONS ORDERED IN ED:  Medications  rho (d) immune globulin (RHIG/RHOPHYLAC) injection 300 mcg (300 mcg Intramuscular Given 12/27/22 0919)     IMPRESSION / MDM / ASSESSMENT AND PLAN / ED COURSE   I have reviewed the triage note.  Differential diagnosis includes, but is not limited to, vaginal bleeding in pregnancy, subchorionic hematoma, missed abortion, miscarriage  Patient's presentation is most consistent with acute presentation with potential threat to life or bodily function.  21 year old female presenting to the emergency department for treatment and evaluation of vaginal bleeding that started approximately 5 AM this morning.  See HPI for further details.  Exam is reassuring.  Labs  and ultrasound pending.  Beta hCG shows a value of 90,834 with blood type of O negative. Rhogam ordered.   US shows she is measuring 11 weeks and 4 days with FHR of 164. Results discussed with patient who feels relieved all is well at this point. She was advised to continue prenatal vitamins and keep her upcoming OB intake appointment scheduled for the end of the week. If any symptom changes or worsens beforehand, she is to return to the ER.      FINAL CLINICAL IMPRESSION(S) / ED DIAGNOSES   Final diagnoses:  Vaginal bleeding in pregnancy, first trimester     Rx / DC Orders   ED Discharge Orders     None        Note:  This document was prepared using Dragon voice recognition software and may include unintentional dictation errors.   Chinita Pester, FNP 12/27/22 1341    Jene Every, MD 12/27/22 1513

## 2022-12-28 LAB — RHOGAM INJECTION: Unit division: 0

## 2022-12-30 LAB — OB RESULTS CONSOLE HEPATITIS B SURFACE ANTIGEN: Hepatitis B Surface Ag: NEGATIVE

## 2022-12-30 LAB — OB RESULTS CONSOLE RUBELLA ANTIBODY, IGM: Rubella: IMMUNE

## 2022-12-30 LAB — OB RESULTS CONSOLE VARICELLA ZOSTER ANTIBODY, IGG: Varicella: IMMUNE

## 2023-01-12 DIAGNOSIS — Z419 Encounter for procedure for purposes other than remedying health state, unspecified: Secondary | ICD-10-CM | POA: Diagnosis not present

## 2023-02-10 DIAGNOSIS — Z3482 Encounter for supervision of other normal pregnancy, second trimester: Secondary | ICD-10-CM | POA: Diagnosis not present

## 2023-02-12 DIAGNOSIS — Z419 Encounter for procedure for purposes other than remedying health state, unspecified: Secondary | ICD-10-CM | POA: Diagnosis not present

## 2023-02-20 ENCOUNTER — Telehealth: Payer: Self-pay | Admitting: Family

## 2023-02-20 NOTE — Telephone Encounter (Signed)
Spoke to pt and sent her [employer] as well on DNA testing in [employer] - Locations in [place] [email or web address]) [facility] - Locations in [place]  [facility] & paternity testing in [place], [place] at 3 clinics. Call [phone] or schedule online. Results can be used for legal purposes or peace of mind. Appts can be together or separate. Cheek swab.Marland KitchenMarland Kitchen

## 2023-02-20 NOTE — Telephone Encounter (Signed)
The patient called stating she is needing a paternity test done. She would like it done a soon as possible. She stated she thinks [facility] does the paternity testing.

## 2023-03-14 DIAGNOSIS — Z419 Encounter for procedure for purposes other than remedying health state, unspecified: Secondary | ICD-10-CM | POA: Diagnosis not present

## 2023-04-14 DIAGNOSIS — Z419 Encounter for procedure for purposes other than remedying health state, unspecified: Secondary | ICD-10-CM | POA: Diagnosis not present

## 2023-05-04 ENCOUNTER — Telehealth (HOSPITAL_COMMUNITY): Payer: Self-pay

## 2023-05-04 NOTE — Telephone Encounter (Signed)
Lvm to confirm 05/08/23 appt by 12:00 05/05/23

## 2023-05-04 NOTE — Telephone Encounter (Signed)
Pt called back and confirmed 05/08/23 appt

## 2023-05-08 ENCOUNTER — Ambulatory Visit (HOSPITAL_COMMUNITY): Payer: 59 | Admitting: Psychiatry

## 2023-05-08 ENCOUNTER — Encounter (HOSPITAL_COMMUNITY): Payer: Self-pay | Admitting: Psychiatry

## 2023-05-08 DIAGNOSIS — F5104 Psychophysiologic insomnia: Secondary | ICD-10-CM

## 2023-05-08 DIAGNOSIS — F50811 Binge eating disorder, moderate: Secondary | ICD-10-CM

## 2023-05-08 DIAGNOSIS — Z6981 Encounter for mental health services for victim of other abuse: Secondary | ICD-10-CM

## 2023-05-08 DIAGNOSIS — F331 Major depressive disorder, recurrent, moderate: Secondary | ICD-10-CM

## 2023-05-08 DIAGNOSIS — F411 Generalized anxiety disorder: Secondary | ICD-10-CM

## 2023-05-08 DIAGNOSIS — F603 Borderline personality disorder: Secondary | ICD-10-CM | POA: Diagnosis not present

## 2023-05-08 DIAGNOSIS — F410 Panic disorder [episodic paroxysmal anxiety] without agoraphobia: Secondary | ICD-10-CM

## 2023-05-08 DIAGNOSIS — F4310 Post-traumatic stress disorder, unspecified: Secondary | ICD-10-CM | POA: Diagnosis not present

## 2023-05-08 DIAGNOSIS — Z8659 Personal history of other mental and behavioral disorders: Secondary | ICD-10-CM

## 2023-05-08 DIAGNOSIS — Z87898 Personal history of other specified conditions: Secondary | ICD-10-CM

## 2023-05-08 NOTE — Patient Instructions (Addendum)
We did not make any medication changes today per your preference but do look into the dialectical behavioral therapy workbook (DBT) found here: https://www.mosley.info/.pdf  I would also encourage you to call your insurer to find a therapist that is in network and it may be easier to find a cognitive behavioral therapy (CBT) provider than the DBT one.  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  When you get a chance, coordinate with your primary care provider or OB provider to get an updated vitamin D, B12, folate and he may want to consider getting an iron panel as well.  As we discussed, Zoloft is the gold standard as the passes into the breast milk the least and you can see the general safety profile found here: PhysicalDelivery.ca  You could also consider fluoxetine (Prozac) which also has a fairly good safety profile for breast-feeding: http://white.info/  Mood stabilizing agents that are commonly used in the postpartum period are Seroquel: PainGain.tn  And Lamictal: http://jones-macias.info/
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
## 2023-05-14 DIAGNOSIS — Z419 Encounter for procedure for purposes other than remedying health state, unspecified: Secondary | ICD-10-CM | POA: Diagnosis not present

## 2023-05-30 ENCOUNTER — Encounter: Payer: Self-pay | Admitting: Internal Medicine

## 2023-05-30 ENCOUNTER — Ambulatory Visit: Payer: Managed Care, Other (non HMO) | Admitting: Internal Medicine

## 2023-05-30 VITALS — BP 96/52 | HR 106 | Ht 64.0 in | Wt 170.2 lb

## 2023-05-30 DIAGNOSIS — J069 Acute upper respiratory infection, unspecified: Secondary | ICD-10-CM | POA: Diagnosis not present

## 2023-05-30 MED ORDER — AMOXICILLIN 500 MG PO TABS: 500.0000 mg | ORAL_TABLET | Freq: Three times a day (TID) | ORAL | 0 refills | Status: DC

## 2023-05-30 MED ORDER — PREDNISONE 10 MG PO TABS: ORAL_TABLET | ORAL | 0 refills | Status: DC

## 2023-05-30 MED ORDER — IPRATROPIUM BROMIDE 0.06 % NA SOLN: 2.0000 | Freq: Four times a day (QID) | NASAL | 12 refills | Status: DC

## 2023-05-30 NOTE — Assessment & Plan Note (Signed)
Sinus pressure and bilateral ear pain worrisome for developing otitis due to sinus congestion.  Prednisoone and atrovent spray prescribed to start today .tylenol for pain.  Add amoxicillin tomorrow if symptoms worsen , and daily use of a probiotic advised for 3 weeks.

## 2023-05-30 NOTE — Patient Instructions (Signed)
Start the prednisone taper tonight along with the nasal spray  Add the amoxicillin tomorrow if your ear or facial pain becomes worse   Ok to use tylenol for the ear and facial pain   Daily use of a probiotic (yogurt) is advised for 3 weeks if you start the amoxicillin .

## 2023-05-30 NOTE — Progress Notes (Unsigned)
Subjective:  Patient ID: Robyn Gonzalez, female    DOB: [birth year]  Age: 22 y.o. MRN: [id number]  CC: There were no encounter diagnoses.   HPI Robyn Gonzalez presents for  Chief Complaint  Patient presents with  . Cough    Runny stuffy nose, ears itch, cough, itchy throat, may have ear infection (right hurts more than her left), patient is pregnant   Robyn Gonzalez is a 22 yr old pregnant female (4- 34 weeks ) who presents with a 2 day history of non productive cough,   clear rhinitis/sinus congestion, sinus pain and bilateral ear pressure,  right greater than left without fevers  but with bilateral ear pain . She was seen by OBGYN yesterday and symptoms had just started yesterday morning but have become more problematic over the last 24 hours  and she feels generally miserable due to lac of sleep   she received the RSV vaccine. Yesterday     Outpatient Medications Prior to Visit  Medication Sig Dispense Refill  . Ferrous Sulfate (IRON PO) Take 1 tablet by mouth daily.    . ondansetron (ZOFRAN-ODT) 4 MG disintegrating tablet Take 4 mg by mouth every 8 (eight) hours as needed for nausea. (Patient not taking: Reported on 05/30/2023)     No facility-administered medications prior to visit.    Review of Systems;  Patient denies headache, fevers, malaise, unintentional weight loss, skin rash, eye pain, sinus congestion and sinus pain, sore throat, dysphagia,  hemoptysis , cough, dyspnea, wheezing, chest pain, palpitations, orthopnea, edema, abdominal pain, nausea, melena, diarrhea, constipation, flank pain, dysuria, hematuria, urinary  Frequency, nocturia, numbness, tingling, seizures,  Focal weakness, Loss of consciousness,  Tremor, insomnia, depression, anxiety, and suicidal ideation.      Objective:  BP (!) 96/52   Pulse (!) 106   Ht 5\' 4"  (1.626 m)   Wt 170 lb 3.2 oz (77.2 kg)   LMP 10/14/2022 (Exact Date)   SpO2 96%   BMI 29.21 kg/m   BP Readings from Last 3 Encounters:   05/30/23 (!) 96/52  12/27/22 118/87  11/03/22 118/60    Wt Readings from Last 3 Encounters:  05/30/23 170 lb 3.2 oz (77.2 kg)  12/27/22 140 lb (63.5 kg)  11/03/22 140 lb (63.5 kg)    Physical Exam Vitals reviewed.  Constitutional:      General: She is not in acute distress.    Appearance: Normal appearance. She is normal weight. She is not ill-appearing, toxic-appearing or diaphoretic.     Comments: Pregnant [redacted] weeks   HENT:     Head: Normocephalic.     Right Ear: Tympanic membrane is erythematous and retracted.     Left Ear: Tympanic membrane is erythematous and retracted.     Nose: Rhinorrhea present.     Mouth/Throat:     Mouth: Mucous membranes are moist.  Eyes:     General: No scleral icterus.       Right eye: No discharge.        Left eye: No discharge.     Conjunctiva/sclera: Conjunctivae normal.     Pupils: Pupils are equal, round, and reactive to light.  Musculoskeletal:        General: Normal range of motion.  Skin:    General: Skin is warm and dry.  Neurological:     General: No focal deficit present.     Mental Status: She is alert and oriented to person, place, and time. Mental status is at baseline.  Psychiatric:  Mood and Affect: Mood normal.        Behavior: Behavior normal.        Thought Content: Thought content normal.        Judgment: Judgment normal.   No results found for: "HGBA1C"  Lab Results  Component Value Date   CREATININE 0.68 12/27/2022   CREATININE 0.73 05/25/2021   CREATININE 0.76 04/23/2018    Lab Results  Component Value Date   WBC 14.8 (H) 12/27/2022   HGB 13.1 12/27/2022   HCT 39.9 12/27/2022   PLT 336 12/27/2022   GLUCOSE 84 12/27/2022   CHOL 156 05/25/2021   TRIG 178.0 (H) 05/25/2021   HDL 47.00 05/25/2021   LDLCALC 73 05/25/2021   ALT 11 12/27/2022   AST 13 (L) 12/27/2022   NA 136 12/27/2022   K 3.8 12/27/2022   CL 106 12/27/2022   CREATININE 0.68 12/27/2022   BUN 16 12/27/2022   CO2 22 12/27/2022    TSH 2.18 05/25/2021    US OB Comp Less 14 Wks Result Date: 12/27/2022 CLINICAL DATA:  Vaginal bleeding with positive pregnancy test. EXAM: OBSTETRIC <14 WK ULTRASOUND TECHNIQUE: Transabdominal ultrasound was performed for evaluation of the gestation as well as the maternal uterus and adnexal regions. COMPARISON:  None Available. FINDINGS: Intrauterine gestational sac: Single Yolk sac:  Visualized Embryo:  Visualized Cardiac Activity: Visualized Heart Rate: 164 bpm CRL:   50.1 mm   11 w 5 d                  [place] EDC: 07/13/2023 Subchorionic hemorrhage:  None visualized. Maternal uterus/adnexae: No adnexal mass. No free fluid in the cul-de-sac. IMPRESSION: Single living intrauterine gestation at estimated 11 week 5 day gestational age by crown-rump length. Electronically Signed   By: Kennith Center M.D.   On: 12/27/2022 07:56    Assessment & Plan:  .There are no diagnoses linked to this encounter.   I provided 30 minutes of face-to-face time during this encounter reviewing patient's last visit with me, patient's  most recent visit with cardiology,  nephrology,  and neurology,  recent surgical and non surgical procedures, previous  labs and imaging studies, counseling on currently addressed issues,  and post visit ordering to diagnostics and therapeutics .   Follow-up: No follow-ups on file.   Sherlene Shams, MD

## 2023-05-31 LAB — POCT INFLUENZA A/B
Influenza A, POC: NEGATIVE
Influenza B, POC: NEGATIVE

## 2023-05-31 LAB — POC COVID19 BINAXNOW: SARS Coronavirus 2 Ag: NEGATIVE

## 2023-06-14 DIAGNOSIS — Z419 Encounter for procedure for purposes other than remedying health state, unspecified: Secondary | ICD-10-CM | POA: Diagnosis not present

## 2023-06-14 NOTE — L&D Delivery Note (Signed)
Delivery Note  First Stage: Labor onset: 0300 Augmentation : AROM Analgesia /Anesthesia intrapartum: epidural AROM at 0627  Second Stage: Complete dilation at 0930 Onset of pushing at 1016 FHR second stage reassuring moderate variability, accelerations present, recurrent variable decelerations present with good recovery  Delivery of a viable female infant 06/30/2023 at 1136 by Donato Schultz, CNM delivery of fetal head in OA position with restitution to LOA. No nuchal cord;  Anterior then posterior shoulders delivered easily with gentle downward traction. Baby placed on mom's chest, and attended to by peds.  Cord double clamped after cessation of pulsation, cut by FOB Cord blood sample collected   Third Stage: Placenta delivered Tomasa Blase intact with 3 VC @ 1143 Placenta disposition: discarded Uterine tone firm / bleeding light Cervix noted to be at vaginal introitus. Assisted cervix higher in vagina and provided support above pubic bone to hold it in place.  No laceration identified  Anesthesia for repair: none Repair none needed Est. Blood Loss (mL):  Complications: none  Mom to postpartum.  Baby to Couplet care / Skin to Skin.  Newborn: Birth Weight: TBD, infant skin-to-skin  Apgar Scores: 8, 9 Feeding planned: breastfeeding

## 2023-06-26 LAB — OB RESULTS CONSOLE GBS: GBS: POSITIVE

## 2023-06-30 ENCOUNTER — Other Ambulatory Visit: Payer: Self-pay

## 2023-06-30 ENCOUNTER — Inpatient Hospital Stay: Payer: Managed Care, Other (non HMO) | Admitting: Anesthesiology

## 2023-06-30 ENCOUNTER — Encounter: Payer: Self-pay | Admitting: Obstetrics and Gynecology

## 2023-06-30 ENCOUNTER — Inpatient Hospital Stay
Admission: EM | Admit: 2023-06-30 | Discharge: 2023-07-01 | DRG: 806 | Disposition: A | Discharge status: Home / Self Care | Payer: Managed Care, Other (non HMO) | Attending: Obstetrics and Gynecology | Admitting: Obstetrics and Gynecology

## 2023-06-30 DIAGNOSIS — D509 Iron deficiency anemia, unspecified: Secondary | ICD-10-CM | POA: Diagnosis not present

## 2023-06-30 DIAGNOSIS — Z87891 Personal history of nicotine dependence: Secondary | ICD-10-CM | POA: Diagnosis not present

## 2023-06-30 DIAGNOSIS — O26893 Other specified pregnancy related conditions, third trimester: Secondary | ICD-10-CM | POA: Diagnosis present

## 2023-06-30 DIAGNOSIS — O99824 Streptococcus B carrier state complicating childbirth: Secondary | ICD-10-CM | POA: Diagnosis present

## 2023-06-30 DIAGNOSIS — O9081 Anemia of the puerperium: Secondary | ICD-10-CM | POA: Diagnosis not present

## 2023-06-30 DIAGNOSIS — O26899 Other specified pregnancy related conditions, unspecified trimester: Secondary | ICD-10-CM

## 2023-06-30 DIAGNOSIS — Z79899 Other long term (current) drug therapy: Secondary | ICD-10-CM | POA: Diagnosis not present

## 2023-06-30 DIAGNOSIS — A749 Chlamydial infection, unspecified: Secondary | ICD-10-CM

## 2023-06-30 DIAGNOSIS — F4310 Post-traumatic stress disorder, unspecified: Secondary | ICD-10-CM | POA: Diagnosis present

## 2023-06-30 DIAGNOSIS — F603 Borderline personality disorder: Secondary | ICD-10-CM | POA: Diagnosis present

## 2023-06-30 DIAGNOSIS — Z8249 Family history of ischemic heart disease and other diseases of the circulatory system: Secondary | ICD-10-CM | POA: Diagnosis not present

## 2023-06-30 DIAGNOSIS — Z6791 Unspecified blood type, Rh negative: Secondary | ICD-10-CM | POA: Diagnosis not present

## 2023-06-30 DIAGNOSIS — F419 Anxiety disorder, unspecified: Secondary | ICD-10-CM | POA: Diagnosis present

## 2023-06-30 DIAGNOSIS — O99344 Other mental disorders complicating childbirth: Secondary | ICD-10-CM | POA: Diagnosis present

## 2023-06-30 DIAGNOSIS — Z833 Family history of diabetes mellitus: Secondary | ICD-10-CM | POA: Diagnosis not present

## 2023-06-30 DIAGNOSIS — F331 Major depressive disorder, recurrent, moderate: Secondary | ICD-10-CM | POA: Diagnosis present

## 2023-06-30 DIAGNOSIS — F410 Panic disorder [episodic paroxysmal anxiety] without agoraphobia: Secondary | ICD-10-CM | POA: Diagnosis present

## 2023-06-30 DIAGNOSIS — Z3A37 37 weeks gestation of pregnancy: Secondary | ICD-10-CM | POA: Diagnosis not present

## 2023-06-30 DIAGNOSIS — O479 False labor, unspecified: Secondary | ICD-10-CM | POA: Diagnosis present

## 2023-06-30 LAB — OB RESULTS CONSOLE HIV ANTIBODY (ROUTINE TESTING): HIV: NONREACTIVE

## 2023-06-30 LAB — URINALYSIS, ROUTINE W REFLEX MICROSCOPIC: RBC / HPF: >50 RBC/hpf (ref 0–5)

## 2023-06-30 LAB — CBC
HCT: 33.9 % — ABNORMAL LOW (ref 36.0–46.0)
Hemoglobin: 11.5 g/dL — ABNORMAL LOW (ref 12.0–15.0)
MCH: 31.1 pg (ref 26.0–34.0)
MCHC: 33.9 g/dL (ref 30.0–36.0)
MCV: 91.6 fL (ref 80.0–100.0)
Platelets: 218 10*3/uL (ref 150–400)
RBC: 3.7 MIL/uL — ABNORMAL LOW (ref 3.87–5.11)
RDW: 13.2 % (ref 11.5–15.5)
WBC: 19.7 10*3/uL — ABNORMAL HIGH (ref 4.0–10.5)
nRBC: 0 % (ref 0.0–0.2)

## 2023-06-30 LAB — TYPE AND SCREEN
ABO/RH(D): O NEG
Antibody Screen: POSITIVE

## 2023-06-30 LAB — OB RESULTS CONSOLE GC/CHLAMYDIA
Chlamydia: NEGATIVE
Neisseria Gonorrhea: NEGATIVE

## 2023-06-30 LAB — WET PREP, GENITAL
Clue Cells Wet Prep HPF POC: NONE SEEN
Trich, Wet Prep: NONE SEEN
WBC, Wet Prep HPF POC: <10
Yeast Wet Prep HPF POC: NONE SEEN

## 2023-06-30 LAB — RPR: RPR Ser Ql: NONREACTIVE

## 2023-06-30 MED ORDER — LACTATED RINGERS IV SOLN: INTRAVENOUS | Status: DC

## 2023-06-30 MED ORDER — LACTATED RINGERS IV SOLN
500.0000 mL | Freq: Once | INTRAVENOUS | Status: AC
  Administered 2023-06-30: 500 mL via INTRAVENOUS

## 2023-06-30 MED ORDER — LIDOCAINE HCL (PF) 1 % IJ SOLN
INTRAMUSCULAR | Status: DC | PRN
  Administered 2023-06-30: 3 mL via SUBCUTANEOUS

## 2023-06-30 MED ORDER — FENTANYL-BUPIVACAINE-NACL 0.5-0.125-0.9 MG/250ML-% EP SOLN
12.0000 mL/h | EPIDURAL | Status: DC | PRN
  Administered 2023-06-30: 12 mL/h via EPIDURAL

## 2023-06-30 MED ORDER — ONDANSETRON HCL 4 MG PO TABS: 4.0000 mg | ORAL_TABLET | ORAL | Status: DC | PRN

## 2023-06-30 MED ORDER — ZOLPIDEM TARTRATE 5 MG PO TABS: 5.0000 mg | ORAL_TABLET | Freq: Every evening | ORAL | Status: DC | PRN

## 2023-06-30 MED ORDER — AMMONIA AROMATIC IN INHA
RESPIRATORY_TRACT | Status: AC
  Filled 2023-06-30: qty 10

## 2023-06-30 MED ORDER — FENTANYL-BUPIVACAINE-NACL 0.5-0.125-0.9 MG/250ML-% EP SOLN
EPIDURAL | Status: AC
  Filled 2023-06-30: qty 250

## 2023-06-30 MED ORDER — PRENATAL MULTIVITAMIN CH
1.0000 | ORAL_TABLET | Freq: Every day | ORAL | Status: DC
  Administered 2023-06-30 – 2023-07-01 (×2): 1 via ORAL
  Filled 2023-06-30 (×2): qty 1

## 2023-06-30 MED ORDER — EPHEDRINE 5 MG/ML INJ
10.0000 mg | INTRAVENOUS | Status: DC | PRN
Start: 2023-06-30 — End: 2023-06-30

## 2023-06-30 MED ORDER — IBUPROFEN 600 MG PO TABS
600.0000 mg | ORAL_TABLET | Freq: Four times a day (QID) | ORAL | Status: DC
  Administered 2023-06-30 – 2023-07-01 (×4): 600 mg via ORAL
  Filled 2023-06-30 (×4): qty 1

## 2023-06-30 MED ORDER — PHENYLEPHRINE 80 MCG/ML (10ML) SYRINGE FOR IV PUSH (FOR BLOOD PRESSURE SUPPORT): 80.0000 ug | PREFILLED_SYRINGE | INTRAVENOUS | Status: DC | PRN

## 2023-06-30 MED ORDER — SODIUM CHLORIDE 0.9 % IV SOLN
5.0000 10*6.[IU] | Freq: Once | INTRAVENOUS | Status: AC
  Filled 2023-06-30: qty 5

## 2023-06-30 MED ORDER — OXYTOCIN-SODIUM CHLORIDE 30-0.9 UT/500ML-% IV SOLN
INTRAVENOUS | Status: AC
  Filled 2023-06-30: qty 500

## 2023-06-30 MED ORDER — TETANUS-DIPHTH-ACELL PERTUSSIS 5-2.5-18.5 LF-MCG/0.5 IM SUSY
0.5000 mL | PREFILLED_SYRINGE | Freq: Once | INTRAMUSCULAR | Status: DC
  Filled 2023-06-30: qty 0.5

## 2023-06-30 MED ORDER — SOD CITRATE-CITRIC ACID 500-334 MG/5ML PO SOLN: 30.0000 mL | ORAL | Status: DC | PRN

## 2023-06-30 MED ORDER — DIPHENHYDRAMINE HCL 25 MG PO CAPS: 25.0000 mg | ORAL_CAPSULE | Freq: Four times a day (QID) | ORAL | Status: DC | PRN

## 2023-06-30 MED ORDER — MISOPROSTOL 200 MCG PO TABS
ORAL_TABLET | ORAL | Status: AC
  Filled 2023-06-30: qty 4

## 2023-06-30 MED ORDER — ACETAMINOPHEN 325 MG PO TABS
650.0000 mg | ORAL_TABLET | ORAL | Status: DC | PRN
  Administered 2023-06-30 – 2023-07-01 (×3): 650 mg via ORAL
  Filled 2023-06-30 (×3): qty 2

## 2023-06-30 MED ORDER — ONDANSETRON HCL 4 MG/2ML IJ SOLN: 4.0000 mg | INTRAMUSCULAR | Status: DC | PRN

## 2023-06-30 MED ORDER — BUPIVACAINE HCL (PF) 0.25 % IJ SOLN
INTRAMUSCULAR | Status: DC | PRN
  Administered 2023-06-30 (×2): 4 mL via EPIDURAL

## 2023-06-30 MED ORDER — DIBUCAINE (PERIANAL) 1 % EX OINT
1.0000 | TOPICAL_OINTMENT | CUTANEOUS | Status: DC | PRN
  Filled 2023-06-30: qty 28

## 2023-06-30 MED ORDER — SIMETHICONE 80 MG PO CHEW: 80.0000 mg | CHEWABLE_TABLET | ORAL | Status: DC | PRN

## 2023-06-30 MED ORDER — PENICILLIN G POT IN DEXTROSE 60000 UNIT/ML IV SOLN
3.0000 10*6.[IU] | INTRAVENOUS | Status: DC
  Administered 2023-06-30: 3 10*6.[IU] via INTRAVENOUS
  Filled 2023-06-30: qty 50

## 2023-06-30 MED ORDER — LACTATED RINGERS IV SOLN: 500.0000 mL | INTRAVENOUS | Status: DC | PRN

## 2023-06-30 MED ORDER — OXYTOCIN 10 UNIT/ML IJ SOLN
INTRAMUSCULAR | Status: AC
  Filled 2023-06-30: qty 2

## 2023-06-30 MED ORDER — LIDOCAINE HCL (PF) 1 % IJ SOLN
INTRAMUSCULAR | Status: AC
  Filled 2023-06-30: qty 30

## 2023-06-30 MED ORDER — WITCH HAZEL-GLYCERIN EX PADS
1.0000 | MEDICATED_PAD | CUTANEOUS | Status: DC | PRN
  Filled 2023-06-30: qty 100

## 2023-06-30 MED ORDER — EPHEDRINE 5 MG/ML INJ: 10.0000 mg | INTRAVENOUS | Status: DC | PRN

## 2023-06-30 MED ORDER — LIDOCAINE HCL (PF) 1 % IJ SOLN: 30.0000 mL | INTRAMUSCULAR | Status: DC | PRN

## 2023-06-30 MED ORDER — LIDOCAINE-EPINEPHRINE (PF) 1.5 %-1:200000 IJ SOLN
INTRAMUSCULAR | Status: DC | PRN
  Administered 2023-06-30: 3 mL via PERINEURAL

## 2023-06-30 MED ORDER — SODIUM CHLORIDE 0.9 % IV SOLN
INTRAVENOUS | Status: AC
  Administered 2023-06-30: 5 10*6.[IU] via INTRAVENOUS
  Filled 2023-06-30: qty 5

## 2023-06-30 MED ORDER — ONDANSETRON HCL 4 MG/2ML IJ SOLN: 4.0000 mg | Freq: Four times a day (QID) | INTRAMUSCULAR | Status: DC | PRN

## 2023-06-30 MED ORDER — COCONUT OIL OIL
1.0000 | TOPICAL_OIL | Status: DC | PRN
  Filled 2023-06-30: qty 7.5

## 2023-06-30 MED ORDER — OXYTOCIN BOLUS FROM INFUSION
333.0000 mL | Freq: Once | INTRAVENOUS | Status: AC
  Administered 2023-06-30: 333 mL via INTRAVENOUS

## 2023-06-30 MED ORDER — OXYTOCIN-SODIUM CHLORIDE 30-0.9 UT/500ML-% IV SOLN: 2.5000 [IU]/h | INTRAVENOUS | Status: DC

## 2023-06-30 MED ORDER — DIPHENHYDRAMINE HCL 50 MG/ML IJ SOLN: 12.5000 mg | INTRAMUSCULAR | Status: DC | PRN

## 2023-06-30 MED ORDER — ACETAMINOPHEN 325 MG PO TABS: 650.0000 mg | ORAL_TABLET | ORAL | Status: DC | PRN

## 2023-06-30 MED ORDER — BENZOCAINE-MENTHOL 20-0.5 % EX AERO
1.0000 | INHALATION_SPRAY | CUTANEOUS | Status: DC | PRN
  Filled 2023-06-30: qty 56

## 2023-06-30 MED ORDER — SENNOSIDES-DOCUSATE SODIUM 8.6-50 MG PO TABS
2.0000 | ORAL_TABLET | Freq: Every day | ORAL | Status: DC
  Administered 2023-07-01: 2 via ORAL
  Filled 2023-06-30: qty 2

## 2023-06-30 MED ORDER — OXYCODONE-ACETAMINOPHEN 5-325 MG PO TABS: 2.0000 | ORAL_TABLET | ORAL | Status: DC | PRN

## 2023-06-30 MED ORDER — FLUOXETINE HCL 10 MG PO CAPS
20.0000 mg | ORAL_CAPSULE | Freq: Every day | ORAL | Status: DC
  Administered 2023-06-30: 20 mg via ORAL
  Filled 2023-06-30: qty 2

## 2023-06-30 MED ORDER — OXYCODONE-ACETAMINOPHEN 5-325 MG PO TABS: 1.0000 | ORAL_TABLET | ORAL | Status: DC | PRN

## 2023-06-30 NOTE — Discharge Summary (Signed)
Postpartum Discharge Summary  Patient Name: Robyn Gonzalez DOB: 11/19/2000 MRN: [id number]  Date of admission: 06/30/2023 Delivery date:06/30/2023 Delivering provider: Donato Schultz RENEE Date of discharge: 07/01/2023  Primary OB: [facility] [id number] last menstrual period was 10/14/2022 (exact date). EDC Estimated Date of Delivery: 07/21/23 Gestational Age at Delivery: [redacted]w[redacted]d   Admitting diagnosis: Uterine contractions [O47.9] Intrauterine pregnancy: [redacted]w[redacted]d     Secondary diagnosis:   Principal Problem:   NSVD (normal spontaneous vaginal delivery) Active Problems:   Major depressive disorder, recurrent episode, moderate (HCC)   PTSD (post-traumatic stress disorder)   Borderline personality disorder (HCC)   Generalized anxiety disorder with panic attacks   Uterine contractions   Chlamydia infection affecting pregnancy   Rh negative status during pregnancy   Discharge Diagnosis: Term Pregnancy Delivered                                                Post partum procedures: IV Venofer Augmentation:: Pitocin Complications: None Delivery Type: spontaneous vaginal delivery Anesthesia: epidural anesthesia Placenta: spontaneous To Pathology: No  Laceration: none Episiotomy: none  Prenatal Labs:  Blood type/Rh O neg  Antibody screen neg  Rubella Immune  Varicella Immune  RPR NR  HBsAg Neg  HIV NR  GC neg  Chlamydia Neg @ 36wks  Genetic screening negative  1 hour GTT 95  3 hour GTT    GBS Positive    Hospital course: Onset of Labor With Vaginal Delivery      23 y.o. yo G1P0 at [redacted]w[redacted]d was admitted in Active Labor on 06/30/2023. Labor course was without complication. She received an epidural and progressed to 10/100/+2, labored down, then pushed for , delivering viable female infant over intact perineum. Membrane Rupture Time/Date: 6:27 AM,06/30/2023  Delivery Method:Vaginal, Spontaneous Operative Delivery:N/A Episiotomy: None Lacerations:   None Patient had a postpartum course complicated by iron deficiency anemia. She received po iron and IV Venofer x 1 dose.  She is ambulating, tolerating a regular diet, passing flatus, and urinating well. Patient is discharged home in stable condition on 07/01/23.  Newborn Data: "Fayrene Fearing" Birth date:06/30/2023 Birth time:11:36 AM Gender:Female Living status:Living Apgars:8 ,9  Weight:3250 g  Magnesium Sulfate received: No BMZ received: No Rhophylac:No baby is negative MMR:No Varivax vaccine given: was not indicated T-DaP:Given prenatally Flu: N/A  Transfusion:Yes  Physical exam  Vitals:   06/30/23 2303 07/01/23 0455 07/01/23 0827 07/01/23 1130  BP: 110/67 121/81 112/67 115/73  Pulse: 84 84 78 87  Resp: 18 20 19 18   Temp: 98.1 F (36.7 C) 98.4 F (36.9 C) 97.6 F (36.4 C) 98.5 F (36.9 C)  TempSrc: Oral Oral Oral Oral  SpO2: 99% 100% 100% 100%   General: alert, cooperative, and no distress Lochia: appropriate Uterine Fundus: firm Perineum:minimal edema/intact DVT Evaluation: No evidence of DVT seen on physical exam. Negative Homan's sign. No cords or calf tenderness. No significant calf/ankle edema.  Labs: Lab Results  Component Value Date   WBC 18.1 (H) 07/01/2023   HGB 9.7 (L) 07/01/2023   HCT 29.1 (L) 07/01/2023   MCV 93.3 07/01/2023   PLT 175 07/01/2023      Latest Ref Rng & Units 12/27/2022    6:04 AM  CMP  Glucose 70 - 99 mg/dL 84   BUN 6 - 20 mg/dL 16   Creatinine 7.42 - 1.00 mg/dL 5.95   Sodium 638 -  145 mmol/L 136   Potassium 3.5 - 5.1 mmol/L 3.8   Chloride 98 - 111 mmol/L 106   CO2 22 - 32 mmol/L 22   Calcium 8.9 - 10.3 mg/dL 9.0   Total Protein 6.5 - 8.1 g/dL 7.0   Total Bilirubin 0.3 - 1.2 mg/dL 0.6   Alkaline Phos 38 - 126 U/L 41   AST 15 - 41 U/L 13   ALT 0 - 44 U/L 11    Edinburgh Score:     No data to display          Risk assessment for postpartum VTE and prophylactic treatment: Very high risk factors: None High risk factors:  None Moderate risk factors: BMI 30-40 kg/m2  Postpartum VTE prophylaxis with LMWH not indicated  After visit meds:  Allergies as of 07/01/2023   No Known Allergies      Medication List     STOP taking these medications    amoxicillin 500 MG tablet Commonly known as: AMOXIL   predniSONE 10 MG tablet Commonly known as: DELTASONE       TAKE these medications    acetaminophen 325 MG tablet Commonly known as: Tylenol Take 2 tablets (650 mg total) by mouth every 4 (four) hours as needed (for pain scale < 4).   benzocaine-Menthol 20-0.5 % Aero Commonly known as: DERMOPLAST Apply 1 Application topically as needed for irritation (perineal discomfort).   coconut oil Oil Apply 1 Application topically as needed.   dibucaine 1 % Oint Commonly known as: NUPERCAINAL Place 1 Application rectally as needed for hemorrhoids.   ferrous sulfate 325 (65 FE) MG tablet Take 1 tablet (325 mg total) by mouth every Monday, Wednesday, and Friday. Start taking on: July 03, 2023 What changed:  medication strength how much to take when to take this   FLUoxetine 20 MG tablet Commonly known as: PROZAC Take 20 mg by mouth daily.   ibuprofen 600 MG tablet Commonly known as: ADVIL Take 1 tablet (600 mg total) by mouth every 6 (six) hours.   ipratropium 0.06 % nasal spray Commonly known as: ATROVENT Place 2 sprays into both nostrils 4 (four) times daily.   prenatal multivitamin Tabs tablet Take 1 tablet by mouth daily at 12 noon. Start taking on: July 02, 2023   senna-docusate 8.6-50 MG tablet Commonly known as: Senokot-S Take 2 tablets by mouth daily. Start taking on: July 02, 2023   simethicone 80 MG chewable tablet Commonly known as: MYLICON Chew 1 tablet (80 mg total) by mouth as needed for flatulence.   witch hazel-glycerin pad Commonly known as: TUCKS Apply 1 Application topically as needed for hemorrhoids.       Discharge home in stable condition Infant  Feeding: Breast Infant Disposition:home with mother Discharge instruction: per After Visit Summary and Postpartum booklet. Activity: Advance as tolerated. Pelvic rest for 6 weeks.  Diet: routine diet Anticipated Birth Control: OCPs Postpartum Appointment:6 weeks Additional Postpartum F/U: Postpartum Depression checkup Future Appointments:No future appointments. Follow up Visit:  Follow-up Information     Janyce Llanos, CNM Follow up in 2 week(s).   Specialty: Certified Nurse Midwife Why: 2wk mood check Contact information: [address] [place] [address] [phone]         Janyce Llanos, CNM Follow up in 6 week(s).   Specialty: Certified Nurse Midwife Why: 6wk postpartum Contact information: [address] [place] [address] [phone]  Plan:  Robyn Gonzalez was discharged to home in good condition. Follow-up appointment as directed.    SignedChari Manning, CNM 07/01/2023 11:54 AM

## 2023-06-30 NOTE — Lactation Note (Signed)
This note was copied from a baby's chart. Lactation Consultation Note  Patient Name: Robyn Gonzalez Today's Date: 06/30/2023 Age:25 hours Reason for consult: L&D Initial assessment;Primapara;Early term 37-38.6wks;Breastfeeding assistance;RN request   Maternal Data This is mom's 1st baby SVD. Mom with history of anxiety, depression, bipolar on prozac 20 mg daily(L2).  On initial visit assisted mom with breastfeeding. Has patient been taught Hand Expression?: Yes Does the patient have breastfeeding experience prior to this delivery?: No  Feeding Mother's Current Feeding Choice: Breast Milk Provided tips and strategies to maximize position and latch techniques. Baby readily latched, multiple swallows noted by LC and mom. LATCH Score Latch: Grasps breast easily, tongue down, lips flanged, rhythmical sucking.  Audible Swallowing: Spontaneous and intermittent  Type of Nipple: Everted at rest and after stimulation  Comfort (Breast/Nipple): Soft / non-tender  Hold (Positioning): Assistance needed to correctly position infant at breast and maintain latch.  LATCH Score: 9   Interventions Interventions: Breast feeding basics reviewed;Assisted with latch;Breast massage;Hand express;Breast compression;Adjust position;Support pillows;Position options;Education  Discharge Pump: Personal;DEBP;Hands Free  Consult Status Consult Status: Follow-up from L&D Date: 07/01/23 Follow-up type: In-patient  Update provided to care nurse.  Fuller Song 06/30/2023, 2:59 PM

## 2023-06-30 NOTE — OB Triage Note (Signed)
Patient is a G1 P0 is [redacted]w[redacted]d is seen at [facility]. Pt today is c/o cramping pain and spotting . Patient stated her and partner had sex about an hour ago and when done some blood appeared and there is some noted in the urine sample here. Patient stated no pad was placed after event and drove to hospital. Patient monitors applied FHR 130 good variability. Provider to be notified.

## 2023-06-30 NOTE — H&P (Signed)
OB History & Physical   History of Present Illness:  Chief Complaint:   HPI:  Robyn Gonzalez is a 23 y.o. G1P0 female at [redacted]w[redacted]d dated by LMP c/w early [place].  She presents to L&D for vaginal spotting and painful uterine contractions that are getting stronger and closer together.   Pregnancy Issues: 1. Chlamydia in pregnancy, treated 3x prior to being negative 2. Anxiety/depression/bipolar, taking Prozac 3. Rh negative 4. Headaches   Maternal Medical History:   Past Medical History:  Diagnosis Date   Depression 2011   Migraines 2011    History reviewed. No pertinent surgical history.  No Known Allergies  Prior to Admission medications   Medication Sig Start Date End Date Taking? Authorizing Provider  FLUoxetine (PROZAC) 20 MG tablet Take 20 mg by mouth daily.   Yes [provider]  amoxicillin (AMOXIL) 500 MG tablet Take 1 tablet (500 mg total) by mouth 3 (three) times daily. 4 tablets by mouth one hour prior to procedure Patient not taking: Reported on 06/30/2023 05/30/23   Sherlene Shams, MD  Ferrous Sulfate (IRON PO) Take 1 tablet by mouth daily. Patient not taking: Reported on 06/30/2023    [provider]  ipratropium (ATROVENT) 0.06 % nasal spray Place 2 sprays into both nostrils 4 (four) times daily. Patient not taking: Reported on 06/30/2023 05/30/23   Sherlene Shams, MD  predniSONE (DELTASONE) 10 MG tablet 6 tablets on Day 1 , then reduce by 1 tablet daily until gone Patient not taking: Reported on 06/30/2023 05/30/23   Sherlene Shams, MD  fluticasone [facility]) 50 MCG/ACT nasal spray Place 2 sprays into both nostrils daily. 04/23/18 11/30/18  Tracey Harries, FNP  loratadine (CLARITIN) 10 MG tablet Take 1 tablet (10 mg total) by mouth daily. 01/17/18 12/17/18  Tracey Harries, FNP  sertraline (ZOLOFT) 50 MG tablet Take 1 tablet (50 mg total) by mouth daily. 01/17/18 12/17/18  Tracey Harries, FNP    Prenatal care site: [facility]   Social  History: She  reports that she quit smoking about 3 years ago. Her smoking use included e-cigarettes. She started smoking about 8 years ago. She has never used smokeless tobacco. She reports that she does not currently use alcohol. She reports that she does not currently use drugs.  Family History: family history includes Diabetes Mellitus II in her maternal grandmother; Diabetes type II in her father; Hypertension in her paternal grandfather and paternal grandmother.   Review of Systems: A full review of systems was performed and negative except as noted in the HPI.     Physical Exam:  Vital Signs: BP 130/83   Pulse 90   Temp 98.2 F (36.8 C) (Oral)   LMP 10/14/2022 (Exact Date)  General: no acute distress.  HEENT: normocephalic, atraumatic Heart: regular rate & rhythm.  No murmurs/rubs/gallops Lungs: clear to auscultation bilaterally, normal respiratory effort Abdomen: soft, gravid, non-tender;  EFW: 6.5lb Pelvic:   External: Normal external female genitalia  Cervix: Dilation: 4 / Effacement (%): 60 / Station: -1    Extremities: non-tender, symmetric, mild edema bilaterally.  DTRs: +2  Neurologic: Alert & oriented x 3.    Results for orders placed or performed during the hospital encounter of 06/30/23 (from the past 24 hours)  Urinalysis, Routine w reflex microscopic -Urine, Clean Catch     Status: Abnormal   Collection Time: 06/30/23  2:13 AM  Result Value Ref Range   Color, Urine RED (A) YELLOW   APPearance  TURBID (A) CLEAR   Specific Gravity, Urine  1.005 - 1.030    TEST NOT REPORTED DUE TO COLOR INTERFERENCE OF URINE PIGMENT   pH  5.0 - 8.0    TEST NOT REPORTED DUE TO COLOR INTERFERENCE OF URINE PIGMENT   Glucose, UA (A) NEGATIVE mg/dL    TEST NOT REPORTED DUE TO COLOR INTERFERENCE OF URINE PIGMENT   Hgb urine dipstick (A) NEGATIVE    TEST NOT REPORTED DUE TO COLOR INTERFERENCE OF URINE PIGMENT   Bilirubin Urine (A) NEGATIVE    TEST NOT REPORTED DUE TO COLOR INTERFERENCE  OF URINE PIGMENT   Ketones, ur (A) NEGATIVE mg/dL    TEST NOT REPORTED DUE TO COLOR INTERFERENCE OF URINE PIGMENT   Protein, ur (A) NEGATIVE mg/dL    TEST NOT REPORTED DUE TO COLOR INTERFERENCE OF URINE PIGMENT   Nitrite (A) NEGATIVE    TEST NOT REPORTED DUE TO COLOR INTERFERENCE OF URINE PIGMENT   Leukocytes,Ua (A) NEGATIVE    TEST NOT REPORTED DUE TO COLOR INTERFERENCE OF URINE PIGMENT   RBC / HPF >50 0 - 5 RBC/hpf   WBC, UA 11-20 0 - 5 WBC/hpf   Bacteria, UA RARE (A) NONE SEEN   Squamous Epithelial / HPF 0-5 0 - 5 /HPF   Mucus PRESENT    Sperm, UA PRESENT   Wet prep, genital     Status: None   Collection Time: 06/30/23  2:13 AM   Specimen: Urine, Clean Catch  Result Value Ref Range   Yeast Wet Prep HPF POC NONE SEEN NONE SEEN   Trich, Wet Prep NONE SEEN NONE SEEN   Clue Cells Wet Prep HPF POC NONE SEEN NONE SEEN   WBC, Wet Prep HPF POC <10 <10   Sperm PRESENT     Pertinent Results:  Prenatal Labs: Blood type/Rh O neg  Antibody screen neg  Rubella Immune  Varicella Immune  RPR NR  HBsAg Neg  HIV NR  GC neg  Chlamydia Neg @ 36wks  Genetic screening negative  1 hour GTT 95  3 hour GTT   GBS Positive   FHT: 130bpm, moderate variability, accelerations present, no decelerations, Category I tracing TOCO: uterine contractions q2-77min, palpate moderate SVE:  Dilation: 4 / Effacement (%): 60 / Station: -1    Cephalic by leopolds  No results found.  Assessment:  Robyn Gonzalez is a 23 y.o. G1P0 female at [redacted]w[redacted]d with uterine contractions and cervical dilation with GBS positive status.   Plan:  1. Admit to Labor & Delivery; consents reviewed and obtained - Dr. Algis Downs Schermerhorn notified of admission  2. Fetal Well being  - Fetal Tracing: Category I tracing - Group B Streptococcus ppx indicated: positive, treat with PCN - Presentation: vertex confirmed by SVE   3. Routine OB: - Prenatal labs reviewed, as above - Rh positive - CBC, T&S, RPR on admit - Clear  fluids, IVF   4. Monitoring of Labor  -  Contractions q2-1min, external toco in place -  Pelvis adequate for trial of labor -  Plan for augmentation with AROM and pitocin as needed -  Plan for continuous fetal monitoring  -  Maternal pain control as desired; requesting regional anesthesia when she is more uncomfortable - Anticipate vaginal delivery  5. Post Partum Planning: - Infant feeding: breastfeeding - Contraception: OCPs - Tdap: received AP - Flu: received AP - RSV: received AP  Janyce Llanos, CNM 06/30/23 3:36 AM

## 2023-06-30 NOTE — Progress Notes (Signed)
Labor Progress Note  Robyn Gonzalez is a 23 y.o. G1P0 at [redacted]w[redacted]d by LMP admitted for active labor  Subjective: she is having an urge to push and pushing well  Objective: BP 127/77   Pulse 99   Temp 98.2 F (36.8 C) (Oral)   LMP 10/14/2022 (Exact Date)   SpO2 98%  Notable VS details: reviewed  Fetal Assessment: FHT:  FHR: 130 bpm, variability: moderate,  accelerations:  Present,  decelerations:  Absent Category/reactivity:  Category I UC:   regular, every 2-3 minutes SVE:    Dilation: 10cm  Effacement: 100%  Station:  +2  Consistency: soft  Position: anterior  Membrane status:AROM @ [id number] Amniotic color: clear  Labs: Lab Results  Component Value Date   WBC 19.7 (H) 06/30/2023   HGB 11.5 (L) 06/30/2023   HCT 33.9 (L) 06/30/2023   MCV 91.6 06/30/2023   PLT 218 06/30/2023    Assessment / Plan: 23 year old G1P0 at [redacted]w[redacted]d here with active labor, now second stage  Labor:  Began pushing at 65 with excellent maternal effort and determination, good fetal descent with pushing. Preeclampsia:   BP 127/77 Fetal Wellbeing:  Category I Pain Control:  Epidural, rate decreased to 21ml/hr I/D:   GBS positive, has received 2 doses PCN Anticipated MOD:  NSVD  Janyce Llanos, CNM 06/30/2023, 10:44 AM

## 2023-06-30 NOTE — Progress Notes (Addendum)
Labor Progress Note  Robyn Gonzalez is a 24 y.o. G1P0 at [redacted]w[redacted]d by LMP admitted for active labor  Subjective: she is concerned about labor progress, reports she has not noticed painful contractions in almost 2 hours  Objective: BP 130/83   Pulse 90   Temp 98.2 F (36.8 C) (Oral)   LMP 10/14/2022 (Exact Date)  Notable VS details: reviewed  Fetal Assessment: FHT:  FHR: 135 bpm, variability: moderate,  accelerations:  Present,  decelerations:  Absent Category/reactivity:  Category I UC:   difficulty tracing, likely every 3-49min SVE:    Dilation: 7.5cm  Effacement: 90%  Station:  -1  Consistency: soft  Position: anterior  Membrane status:AROM @ [id number] Amniotic color: clear  Labs: Lab Results  Component Value Date   WBC 19.7 (H) 06/30/2023   HGB 11.5 (L) 06/30/2023   HCT 33.9 (L) 06/30/2023   MCV 91.6 06/30/2023   PLT 218 06/30/2023    Assessment / Plan: 23 year old G1P0 at [redacted]w[redacted]d here with active labor  Labor:  Good labor progress. AROM with clear fluid. She reports she feels more intense contractions after AROM. Preeclampsia:   BP 130/83 Fetal Wellbeing:  Category I Pain Control:   requesting epidural after AROM, anesthesia notified and reports they will come up to L&D around 7am I/D:   GBS positive, received 1st dose PCN at 0332 Anticipated MOD:  NSVD  Janyce Llanos, CNM 06/30/2023, 6:36 AM

## 2023-06-30 NOTE — Progress Notes (Signed)
Labor Progress Note  Robyn Gonzalez is a 23 y.o. G1P0 at [redacted]w[redacted]d by LMP admitted for active labor  Subjective: she does not feel any contractions, pain, or pressure  Objective: BP 127/77   Pulse 99   Temp 98.2 F (36.8 C) (Oral)   LMP 10/14/2022 (Exact Date)   SpO2 98%  Notable VS details: reviewed  Fetal Assessment: FHT:  FHR: 125 bpm, variability: moderate,  accelerations:  Present,  decelerations:  Absent Category/reactivity:  Category I UC:   regular, every 2-3 minutes SVE:    Dilation: 10cm  Effacement: 100%  Station:  +2  Consistency: soft  Position: anterior  Membrane status:AROM @ [id number] Amniotic color: clear  Labs: Lab Results  Component Value Date   WBC 19.7 (H) 06/30/2023   HGB 11.5 (L) 06/30/2023   HCT 33.9 (L) 06/30/2023   MCV 91.6 06/30/2023   PLT 218 06/30/2023    Assessment / Plan: 23 year old G1P0 at [redacted]w[redacted]d here with active labor  Labor:  10/100/+2 but epidural is strong and she is unable to feel contractions, pressure, or where to push. Attempted pushing with one contraction with minimal fetal descent and patient reported she was unable to feel anything. Decreased her epidural rate and will sit upright and labor down for 30-60 minutes, pending fetal wellbeing, to allow her to feel pressure and make for a more productive second stage. She is agreeable with this plan of care. Preeclampsia:   BP 127/77 Fetal Wellbeing:  Category I Pain Control:  Epidural, rate decreased to allow her to feel when and where to push I/D:   GBS positive, received 2 doses PCN Anticipated MOD:  NSVD  Janyce Llanos, CNM 06/30/2023, 9:39 AM

## 2023-06-30 NOTE — Anesthesia Preprocedure Evaluation (Signed)
Anesthesia Evaluation  Patient identified by MRN, date of birth, ID band Patient awake    Reviewed: Allergy & Precautions, H&P , NPO status , Patient's Chart, lab work & pertinent test results  Airway Mallampati: II   Neck ROM: full    Dental no notable dental hx.    Pulmonary neg pulmonary ROS, former smoker   Pulmonary exam normal        Cardiovascular negative cardio ROS Normal cardiovascular exam     Neuro/Psych  Headaches PSYCHIATRIC DISORDERS Anxiety Depression    scoliosis    GI/Hepatic negative GI ROS, Neg liver ROS,,,  Endo/Other  negative endocrine ROS    Renal/GU negative Renal ROS  negative genitourinary   Musculoskeletal   Abdominal   Peds  Hematology negative hematology ROS (+)   Anesthesia Other Findings   Reproductive/Obstetrics (+) Pregnancy                             Anesthesia Physical Anesthesia Plan  ASA: 2  Anesthesia Plan: Epidural   Post-op Pain Management:    Induction:   PONV Risk Score and Plan:   Airway Management Planned:   Additional Equipment:   Intra-op Plan:   Post-operative Plan:   Informed Consent: I have reviewed the patients History and Physical, chart, labs and discussed the procedure including the risks, benefits and alternatives for the proposed anesthesia with the patient or authorized representative who has indicated his/her understanding and acceptance.       Plan Discussed with: Anesthesiologist and CRNA  Anesthesia Plan Comments:        Anesthesia Quick Evaluation

## 2023-06-30 NOTE — Anesthesia Procedure Notes (Signed)
Epidural Patient location during procedure: OB Start time: 06/30/2023 7:08 AM End time: 06/30/2023 7:16 AM  Staffing Anesthesiologist: Corinda Gubler, MD Resident/CRNA: Hezzie Bump, CRNA Performed: resident/CRNA   Preanesthetic Checklist Completed: patient identified, IV checked, site marked, risks and benefits discussed, surgical consent, monitors and equipment checked, pre-op evaluation and timeout performed  Epidural Patient position: sitting Prep: ChloraPrep Patient monitoring: heart rate, continuous pulse ox and blood pressure Approach: midline Location: L3-L4 Injection technique: LOR saline  Needle:  Needle type: Tuohy  Needle gauge: 17 G Needle length: 9 cm and 9 Needle insertion depth: 6.5 cm Catheter type: closed end flexible Catheter size: 19 Gauge Catheter at skin depth: 11 cm Test dose: negative and 1.5% lidocaine with Epi 1:200 K  Assessment Sensory level: T10 Events: blood not aspirated, no cerebrospinal fluid, injection not painful, no injection resistance, no paresthesia and negative IV test  Additional Notes 1 attempt Pt. Evaluated and documentation done after procedure finished. Patient identified. Risks/Benefits/Options discussed with patient including but not limited to bleeding, infection, nerve damage, paralysis, failed block, incomplete pain control, headache, blood pressure changes, nausea, vomiting, reactions to medication both or allergic, itching and postpartum back pain. Confirmed with bedside nurse the patient's most recent platelet count. Confirmed with patient that they are not currently taking any anticoagulation, have any bleeding history or any family history of bleeding disorders. Patient expressed understanding and wished to proceed. All questions were answered. Sterile technique was used throughout the entire procedure. Please see nursing notes for vital signs. Test dose was given through epidural catheter and negative prior to continuing to  dose epidural or start infusion. Warning signs of high block given to the patient including shortness of breath, tingling/numbness in hands, complete motor block, or any concerning symptoms with instructions to call for help. Patient was given instructions on fall risk and not to get out of bed. All questions and concerns addressed with instructions to call with any issues or inadequate analgesia.    Patient tolerated the insertion well without immediate complications.Reason for block:procedure for pain

## 2023-07-01 ENCOUNTER — Encounter: Payer: Self-pay | Admitting: Obstetrics and Gynecology

## 2023-07-01 LAB — CBC
HCT: 29.1 % — ABNORMAL LOW (ref 36.0–46.0)
Hemoglobin: 9.7 g/dL — ABNORMAL LOW (ref 12.0–15.0)
MCH: 31.1 pg (ref 26.0–34.0)
MCHC: 33.3 g/dL (ref 30.0–36.0)
MCV: 93.3 fL (ref 80.0–100.0)
Platelets: 175 10*3/uL (ref 150–400)
RBC: 3.12 MIL/uL — ABNORMAL LOW (ref 3.87–5.11)
RDW: 13.8 % (ref 11.5–15.5)
WBC: 18.1 10*3/uL — ABNORMAL HIGH (ref 4.0–10.5)
nRBC: 0 % (ref 0.0–0.2)

## 2023-07-01 MED ORDER — COCONUT OIL OIL: 1.0000 | TOPICAL_OIL | Status: DC | PRN

## 2023-07-01 MED ORDER — IBUPROFEN 600 MG PO TABS: 600.0000 mg | ORAL_TABLET | Freq: Four times a day (QID) | ORAL | 0 refills | Status: DC

## 2023-07-01 MED ORDER — DIBUCAINE (PERIANAL) 1 % EX OINT: 1.0000 | TOPICAL_OINTMENT | CUTANEOUS | Status: DC | PRN

## 2023-07-01 MED ORDER — PRENATAL MULTIVITAMIN CH: 1.0000 | ORAL_TABLET | Freq: Every day | ORAL | Status: DC

## 2023-07-01 MED ORDER — SIMETHICONE 80 MG PO CHEW: 80.0000 mg | CHEWABLE_TABLET | ORAL | Status: DC | PRN

## 2023-07-01 MED ORDER — ACETAMINOPHEN 325 MG PO TABS: 650.0000 mg | ORAL_TABLET | ORAL | Status: DC | PRN

## 2023-07-01 MED ORDER — SENNOSIDES-DOCUSATE SODIUM 8.6-50 MG PO TABS: 2.0000 | ORAL_TABLET | Freq: Every day | ORAL | Status: DC

## 2023-07-01 MED ORDER — WITCH HAZEL-GLYCERIN EX PADS: 1.0000 | MEDICATED_PAD | CUTANEOUS | Status: DC | PRN

## 2023-07-01 MED ORDER — IRON SUCROSE 300 MG IVPB - SIMPLE MED
300.0000 mg | Freq: Once | Status: AC
  Administered 2023-07-01: 300 mg via INTRAVENOUS
  Filled 2023-07-01: qty 300

## 2023-07-01 MED ORDER — BENZOCAINE-MENTHOL 20-0.5 % EX AERO: 1.0000 | INHALATION_SPRAY | CUTANEOUS | Status: DC | PRN

## 2023-07-01 MED ORDER — FERROUS SULFATE 325 (65 FE) MG PO TABS
325.0000 mg | ORAL_TABLET | ORAL | Status: DC
  Administered 2023-07-01: 325 mg via ORAL
  Filled 2023-07-01: qty 1

## 2023-07-01 MED ORDER — FERROUS SULFATE 325 (65 FE) MG PO TABS: 325.0000 mg | ORAL_TABLET | ORAL | Status: DC

## 2023-07-01 NOTE — Lactation Note (Signed)
This note was copied from a baby's chart. Lactation Consultation Note  Patient Name: Robyn Gonzalez Today's Date: 07/01/2023 Age:25 hours Reason for consult: Follow-up assessment;Primapara;Early term 37-38.6wks;Maternal discharge   Maternal Data Has patient been taught Hand Expression?: Yes Does the patient have breastfeeding experience prior to this delivery?: No  Feeding Mother's Current Feeding Choice: Breast Milk Mom reports baby gassy today, wanting to nurse frequently, mom encouraged, baby latches easily to left breast in cradle hold, occ swallow, sleepy at breast but latched because mom wanted me to observe, sl nipple tenderness, mom has organic balm but recommended coconut oil, this is to be given by her care nurse, Maxine Glenn.   LATCH Score Latch: Grasps breast easily, tongue down, lips flanged, rhythmical sucking.  Audible Swallowing: A few with stimulation  Type of Nipple: Everted at rest and after stimulation  Comfort (Breast/Nipple): Filling, red/small blisters or bruises, mild/mod discomfort (tender nipples)  Hold (Positioning): No assistance needed to correctly position infant at breast.  LATCH Score: 8   Lactation Tools Discussed/Used  LC anem and no updated on white board  Interventions Interventions: Hand express;Adjust position;Education Coconut oil Discharge Discharge Education: Engorgement and breast care;Warning signs for feeding baby Pump: Personal WIC Program: No  Consult Status Consult Status: PRN Date: 07/01/23 Follow-up type: In-patient    Dyann Kief 07/01/2023, 12:24 PM

## 2023-07-01 NOTE — Progress Notes (Signed)
Discharge instructions reviewed with patient who verbalized understanding.  Follow up instructions reviewed with patient, denies further questions or concerns at this time. Patient discharged home with infant.

## 2023-07-01 NOTE — Discharge Instructions (Signed)
  Contact a health care provider if: You feel unusually sad or worried. Your breasts become red, painful, or hard. You have nausea and vomiting and are unable to eat or drink anything for 24 hours. You have a fever or other signs of infection. You have bleeding that soaks through one pad an hour or you have blood clots the size of an egg or larger. You have a severe headache that does not go away or you have a headache with vision changes. Get help right away if: You have chest pain or difficulty breathing. You have sudden, severe leg pain. You faint or have a seizure. You have thoughts about hurting yourself or your baby. You have any of the following symptoms and you were unable to reach your health care provider: A fever or other signs of infection. Bleeding that is soaking through one pad an hour or you have blood clots the size of an egg or larger. A severe headache that does not go away or you have a headache with vision changes.

## 2023-07-01 NOTE — Clinical Social Work Maternal (Signed)
CLINICAL SOCIAL WORK MATERNAL/CHILD NOTE  Patient Details  Name: Robyn Gonzalez MRN: [id number] Date of Birth: [birth year]  Date:  07/01/2023  Clinical Social Worker Initiating Note:  Velora Mediate Date/Time: Initiated:  07/01/23/1310     Child's Name:  Robyn Gonzalez   Biological Parents:  Mother, Father   Need for Interpreter:  None   Reason for Referral:  Other (Comment) (history of anixety and depression)   Address:  [address] [place] [address]    Phone number:  [phone] (home)     Additional phone number: N/A  Household Members/Support Persons (HM/SP):   Household Member/Support Person 1, Household Member/Support Person 2   HM/SP Name Relationship DOB or Age  HM/SP -1 Robyn Gonzalez MOB mother    HM/SP -2 Robyn Gonzalez MOD dad    HM/SP -3        HM/SP -4        HM/SP -5        HM/SP -6        HM/SP -7        HM/SP -8          Natural Supports (not living in the home):  Other (Comment) (FOB)   Professional Supports: Other (Comment) Tax inspector)   Employment: Unemployed   Type of Work:     Education:  Attending college   Homebound arranged:    Surveyor, quantity Resources:  Other (Comment), Administrator, sports)   Other Resources:  [facility]   Cultural/Religious Considerations Which May Impact Care:  N/a  Strengths:  Ability to meet basic needs  , Understanding of illness, Psychotropic Medications   Psychotropic Medications:  Prozac      Pediatrician:     [facility]  Pediatrician Fax Number: [phone]  Risk Factors/Current Problems:  Mental Health Concerns     Cognitive State:  Alert  , Goal Oriented  , Insightful     Mood/Affect:  Comfortable  , Bright     CSW Assessment: SW spoke with MOB at bedside. MOB mother and cousin were present at bedside. SW asked if MOB wanted them to step out of the room- pt states they can stay. MOB states she is tired but is feeling ok. MOM address is [address] [place], [address].  FOB is name Robyn Gonzalez.  MOB states she has several family and friend support. Pt is in college to become a Research officer, trade union.  MON is not employed. MOB lives with her mom, dad, and now baby. FOB does not live at this home. MOB states FOB is involved though. MOB gets WIC. SW reminded MOB to contact them to let them know baby has been born- MOB understanding. MOB has mental health diagnosis/hx- anxiety, depression, bipolar, possible borderline personality disorder, and PTSD. MOB is on psychotropic medications and has regular follow up with a psychiatrist. MOB is going to see her psychiatrist in Feb to check in and address new symptoms after birth of baby. MOB has PCP- [facility] in [place]. MOB does not have a therapist or Counselor but states that is next on the list and she is discussing this with psychiatrist. MOB declined any SI or HI. SW informed MOB of PPD and provided the progress sheet- MOB expressed understanding. SW informed MOB of SIDS and safe sleep and provided the SIDS information sheet- MOB expressed understanding. MOB is going to call and make baby his apt at [facility]. MOB states baby has a bassinet, pack and play, clothes, abd care seat. MOB states  either her mom or FOB will transport her and baby home. MOB appeared to have a well understanding of her mental health and the symptoms that may manifest after birth of baby. MOB has supports and has a established medical team.  CSW Plan/Description:  No Further Intervention Required/No Barriers to Discharge    Anastasiya Gowin A Kimoni Pickerill, LCSW 07/01/2023, 1:15 PM

## 2023-07-03 NOTE — Anesthesia Postprocedure Evaluation (Signed)
Anesthesia Post Note  Patient: Robyn Gonzalez  Procedure(s) Performed: AN AD HOC LABOR EPIDURAL  Anesthesia Type: Epidural Pain management: pain level controlled Vital Signs Assessment: post-procedure vital signs reviewed and stable Respiratory status: respiratory function stable Cardiovascular status: stable Postop Assessment: no headache and no backache Anesthetic complications: no Comments: Patient discharged prior to post anesthesia evaluation. Vital signs, and nursing + physician notes reviewed, no apparent complications.   No notable events documented.   Last Vitals: There were no vitals filed for this visit.  Last Pain: There were no vitals filed for this visit.               Corinda Gubler

## 2023-07-15 DIAGNOSIS — Z419 Encounter for procedure for purposes other than remedying health state, unspecified: Secondary | ICD-10-CM | POA: Diagnosis not present

## 2023-08-12 DIAGNOSIS — Z419 Encounter for procedure for purposes other than remedying health state, unspecified: Secondary | ICD-10-CM | POA: Diagnosis not present

## 2023-09-20 LAB — RESULTS CONSOLE HPV: CHL HPV: POSITIVE

## 2023-09-23 DIAGNOSIS — Z419 Encounter for procedure for purposes other than remedying health state, unspecified: Secondary | ICD-10-CM | POA: Diagnosis not present

## 2023-10-19 ENCOUNTER — Encounter: Payer: Self-pay | Admitting: Internal Medicine

## 2023-10-19 ENCOUNTER — Ambulatory Visit (INDEPENDENT_AMBULATORY_CARE_PROVIDER_SITE_OTHER): Admitting: Internal Medicine

## 2023-10-19 ENCOUNTER — Ambulatory Visit: Payer: Self-pay

## 2023-10-19 VITALS — BP 122/72 | HR 102 | Ht 64.0 in | Wt 162.2 lb

## 2023-10-19 DIAGNOSIS — J029 Acute pharyngitis, unspecified: Secondary | ICD-10-CM

## 2023-10-19 LAB — POCT RAPID STREP A (OFFICE): Rapid Strep A Screen: NEGATIVE

## 2023-10-19 MED ORDER — FEXOFENADINE HCL 180 MG PO TABS: 180.0000 mg | ORAL_TABLET | Freq: Every day | ORAL | 2 refills | Status: AC

## 2023-10-19 MED ORDER — FLUTICASONE PROPIONATE 50 MCG/ACT NA SUSP: 2.0000 | Freq: Every day | NASAL | 6 refills | Status: AC

## 2023-10-19 MED ORDER — PREDNISONE 10 MG PO TABS: ORAL_TABLET | ORAL | 0 refills | Status: DC

## 2023-10-19 MED ORDER — AMOXICILLIN-POT CLAVULANATE 875-125 MG PO TABS: 1.0000 | ORAL_TABLET | Freq: Two times a day (BID) | ORAL | 0 refills | Status: DC

## 2023-10-19 MED ORDER — OXYMETAZOLINE HCL 0.05 % NA SOLN: 1.0000 | Freq: Two times a day (BID) | NASAL | 0 refills | Status: AC

## 2023-10-19 NOTE — Telephone Encounter (Signed)
  Chief Complaint: sore throat, URI s/s Symptoms: sore throat, ear ache, runny nose, hoarse voice Frequency: yesterday Pertinent Negatives: Patient denies fever, difficulty breathing, SOB Disposition: [] ED /[] Urgent Care (no appt availability in office) / [x] Appointment(In office/virtual)/ []  [facility] [place] [facility]/ [] Home Care/ [] Refused Recommended Disposition /[] [facility]/ []  Follow-up with PCP Additional Notes: Pt states that her daughter has an ear infection. Pt also states that she is concerned that she could have something that she could spread to her 4 mo child. Pt states that s/s started yesterday. Denies resp issues. Denies pus on tonsils. Scheduled today.   \Reason for Disposition  Earache also present  Answer Assessment - Initial Assessment Questions 1. ONSET: "When did the throat start hurting?" (Hours or days ago)      yesterday 2. SEVERITY: "How bad is the sore throat?" (Scale 1-10; mild, moderate or severe)   - MILD (1-3):  Doesn't interfere with eating or normal activities.   - MODERATE (4-7): Interferes with eating some solids and normal activities.   - SEVERE (8-10):  Excruciating pain, interferes with most normal activities.   - SEVERE WITH DYSPHAGIA (10): Can't swallow liquids, drooling.     5 3. STREP EXPOSURE: "Has there been any exposure to strep within the past week?" If Yes, ask: "What type of contact occurred?"      denies 4.  VIRAL SYMPTOMS: "Are there any symptoms of a cold, such as a runny nose, cough, hoarse voice or red eyes?"      Runny nose, hoarse voice 5. FEVER: "Do you have a fever?" If Yes, ask: "What is your temperature, how was it measured, and when did it start?"     denies 6. PUS ON THE TONSILS: "Is there pus on the tonsils in the back of your throat?"     I don't think so 7. OTHER SYMPTOMS: "Do you have any other symptoms?" (e.g., difficulty breathing, headache, rash)     denies 8. PREGNANCY: "Is there any chance you are  pregnant?" "When was your last menstrual period?"     denies  Protocols used: Sore Throat-A-AH

## 2023-10-19 NOTE — Assessment & Plan Note (Addendum)
 STREP test negative.  Tachycardia is chronic.  Will treat for viral URI /hayfever with prednisone , flonase  and allegra. Advised to  Add augmentin  after 3 days if no improvement , probiotic advised

## 2023-10-19 NOTE — Patient Instructions (Signed)
 Right now your exam suggests either a viral infection or an attack of hay fever from the pollen  I recommend starting EVERYTHING BUT THE AMOXICILLIN (AUGMENTIN  ) now:  Prednsione :  take 6 tablets all at once on Day 1,  Then taper by 1 tablet daily until gone Flonase  nasal spray once daily Allegra once daily  Afrin twice daily IF NEEDED FOR CONGESTION  but STOP AFTER 5 DAYS   If symptoms do not improve in 3-4 days,  or if you develop green snot or worsening ear pain,  START THE AUGMENTIN    If you start the agumentin Please take a probiotic ( Align, Floraque or Culturelle) or eat yougurt daily   For a minimum of 3 weeks to prevent a serious antibiotic associated diarrhea  Called clostridium dificile colitis

## 2023-10-19 NOTE — Telephone Encounter (Signed)
 noted

## 2023-10-19 NOTE — Progress Notes (Signed)
 Subjective:  Patient ID: Robyn Gonzalez, female    DOB: 2001-02-26  Age: 23 y.o. MRN: [id number]  CC: The primary encounter diagnosis was Sore throat. A diagnosis of Acute pharyngitis, unspecified etiology was also pertinent to this visit.   HPI Robyn Gonzalez presents for  Chief Complaint  Patient presents with   Sore Throat   Sneezing.  Sore throat and ear pain started yesterday.    No  fevers or cough, .  No sick contacts.  Some sinus congestion on the left without facial pain .   Feels very fatigued but has a 57 month old.      Outpatient Medications Prior to Visit  Medication Sig Dispense Refill   etonogestrel (NEXPLANON) 68 MG IMPL implant Inject 68 mg into the skin.     FLUoxetine  (PROZAC ) 20 MG tablet Take 20 mg by mouth daily.     acetaminophen  (TYLENOL ) 325 MG tablet Take 2 tablets (650 mg total) by mouth every 4 (four) hours as needed (for pain scale < 4). (Patient not taking: Reported on 10/19/2023)     benzocaine -Menthol  (DERMOPLAST) 20-0.5 % AERO Apply 1 Application topically as needed for irritation (perineal discomfort). (Patient not taking: Reported on 10/19/2023)     coconut oil OIL Apply 1 Application topically as needed. (Patient not taking: Reported on 10/19/2023)     dibucaine (NUPERCAINAL) 1 % OINT Place 1 Application rectally as needed for hemorrhoids. (Patient not taking: Reported on 10/19/2023)     ferrous sulfate  325 (65 FE) MG tablet Take 1 tablet (325 mg total) by mouth every Monday, Wednesday, and Friday. (Patient not taking: Reported on 10/19/2023)     ibuprofen  (ADVIL ) 600 MG tablet Take 1 tablet (600 mg total) by mouth every 6 (six) hours. (Patient not taking: Reported on 10/19/2023) 30 tablet 0   ipratropium (ATROVENT ) 0.06 % nasal spray Place 2 sprays into both nostrils 4 (four) times daily. (Patient not taking: Reported on 06/30/2023) 15 mL 12   Prenatal Vit-Fe Fumarate-FA (PRENATAL MULTIVITAMIN) TABS tablet Take 1 tablet by mouth daily at 12 noon.  (Patient not taking: Reported on 10/19/2023)     senna-docusate (SENOKOT-S) 8.6-50 MG tablet Take 2 tablets by mouth daily. (Patient not taking: Reported on 10/19/2023)     simethicone  (MYLICON) 80 MG chewable tablet Chew 1 tablet (80 mg total) by mouth as needed for flatulence. (Patient not taking: Reported on 10/19/2023)     witch hazel-glycerin  (TUCKS) pad Apply 1 Application topically as needed for hemorrhoids. (Patient not taking: Reported on 10/19/2023)     No facility-administered medications prior to visit.    Review of Systems;  Patient denies headache, fevers, malaise, unintentional weight loss, skin rash, eye pain, sinus congestion and sinus pain, sore throat, dysphagia,  hemoptysis , cough, dyspnea, wheezing, chest pain, palpitations, orthopnea, edema, abdominal pain, nausea, melena, diarrhea, constipation, flank pain, dysuria, hematuria, urinary  Frequency, nocturia, numbness, tingling, seizures,  Focal weakness, Loss of consciousness,  Tremor, insomnia, depression, anxiety, and suicidal ideation.      Objective:  BP 122/72   Pulse (!) 102   Ht 5\' 4"  (1.626 m)   Wt 162 lb 3.2 oz (73.6 kg)   SpO2 98%   BMI 27.84 kg/m   BP Readings from Last 3 Encounters:  10/19/23 122/72  07/01/23 114/79  05/30/23 (!) 96/52    Wt Readings from Last 3 Encounters:  10/19/23 162 lb 3.2 oz (73.6 kg)  05/30/23 170 lb 3.2 oz (77.2 kg)  12/27/22 140 lb (  63.5 kg)    Physical Exam Vitals reviewed.  Constitutional:      General: She is not in acute distress.    Appearance: Normal appearance. She is normal weight. She is not ill-appearing, toxic-appearing or diaphoretic.  HENT:     Head: Normocephalic.     Right Ear: Tympanic membrane and ear canal normal.     Left Ear: Tympanic membrane and ear canal normal.     Nose: Rhinorrhea present.     Mouth/Throat:     Mouth: Mucous membranes are moist. No oral lesions.     Pharynx: Oropharynx is clear. Uvula midline. No oropharyngeal exudate.  Eyes:      General: No scleral icterus.       Right eye: No discharge.        Left eye: No discharge.     Conjunctiva/sclera: Conjunctivae normal.     Pupils: Pupils are equal, round, and reactive to light.  Cardiovascular:     Rate and Rhythm: Tachycardia present.  Musculoskeletal:        General: Normal range of motion.     Cervical back: Normal range of motion and neck supple.  Skin:    General: Skin is warm and dry.  Neurological:     General: No focal deficit present.     Mental Status: She is alert and oriented to person, place, and time. Mental status is at baseline.  Psychiatric:        Mood and Affect: Mood normal.        Behavior: Behavior normal.        Thought Content: Thought content normal.        Judgment: Judgment normal.    No results found for: "HGBA1C"  Lab Results  Component Value Date   CREATININE 0.68 12/27/2022   CREATININE 0.73 05/25/2021   CREATININE 0.76 04/23/2018    Lab Results  Component Value Date   WBC 18.1 (H) 07/01/2023   HGB 9.7 (L) 07/01/2023   HCT 29.1 (L) 07/01/2023   PLT 175 07/01/2023   GLUCOSE 84 12/27/2022   CHOL 156 05/25/2021   TRIG 178.0 (H) 05/25/2021   HDL 47.00 05/25/2021   LDLCALC 73 05/25/2021   ALT 11 12/27/2022   AST 13 (L) 12/27/2022   NA 136 12/27/2022   K 3.8 12/27/2022   CL 106 12/27/2022   CREATININE 0.68 12/27/2022   BUN 16 12/27/2022   CO2 22 12/27/2022   TSH 2.18 05/25/2021    No results found.  Assessment & Plan:  .Sore throat -     POCT rapid strep A  Acute pharyngitis, unspecified etiology Assessment & Plan: STREP test negative.  Tachycardia is chronic.  Will treat for viral URI /hayfever with prednisone , flonase  and allegra. Advised to  Add augmentin  after 3 days if no improvement , probiotic advised    Other orders -     predniSONE ; 6 tablets on Day 1 , then reduce by 1 tablet daily until gone  Dispense: 21 tablet; Refill: 0 -     Fluticasone  Propionate; Place 2 sprays into both nostrils daily.   Dispense: 16 g; Refill: 6 -     Fexofenadine HCl; Take 1 tablet (180 mg total) by mouth daily.  Dispense: 30 tablet; Refill: 2 -     Amoxicillin -Pot Clavulanate; Take 1 tablet by mouth 2 (two) times daily.  Dispense: 14 tablet; Refill: 0 -     Oxymetazoline HCl; Place 1 spray into both nostrils 2 (two) times daily. NOT MORE  THAN 5 DAYS IN A ROW  Dispense: 5 mL; Refill: 0   t  .   Follow-up: No follow-ups on file.   Thersia Flax, MD

## 2023-10-23 DIAGNOSIS — Z419 Encounter for procedure for purposes other than remedying health state, unspecified: Secondary | ICD-10-CM | POA: Diagnosis not present

## 2023-11-23 DIAGNOSIS — Z419 Encounter for procedure for purposes other than remedying health state, unspecified: Secondary | ICD-10-CM | POA: Diagnosis not present

## 2023-12-23 DIAGNOSIS — Z419 Encounter for procedure for purposes other than remedying health state, unspecified: Secondary | ICD-10-CM | POA: Diagnosis not present

## 2024-01-02 ENCOUNTER — Ambulatory Visit (INDEPENDENT_AMBULATORY_CARE_PROVIDER_SITE_OTHER): Admitting: Family Medicine

## 2024-01-02 ENCOUNTER — Encounter: Payer: Self-pay | Admitting: Family Medicine

## 2024-01-02 VITALS — BP 120/79 | HR 84 | Temp 98.7°F | Resp 20 | Ht 64.0 in | Wt 180.0 lb

## 2024-01-02 DIAGNOSIS — F1090 Alcohol use, unspecified, uncomplicated: Secondary | ICD-10-CM | POA: Diagnosis not present

## 2024-01-02 DIAGNOSIS — F331 Major depressive disorder, recurrent, moderate: Secondary | ICD-10-CM | POA: Diagnosis not present

## 2024-01-02 DIAGNOSIS — F339 Major depressive disorder, recurrent, unspecified: Secondary | ICD-10-CM | POA: Diagnosis not present

## 2024-01-02 MED ORDER — BUPROPION HCL ER (SR) 150 MG PO TB12: 150.0000 mg | ORAL_TABLET | Freq: Two times a day (BID) | ORAL | 1 refills | Status: DC

## 2024-01-02 MED ORDER — NALTREXONE HCL 50 MG PO TABS: ORAL_TABLET | ORAL | 3 refills | Status: DC

## 2024-01-02 MED ORDER — FLUOXETINE HCL 20 MG PO TABS: 20.0000 mg | ORAL_TABLET | Freq: Every day | ORAL | 0 refills | Status: AC

## 2024-01-02 NOTE — Assessment & Plan Note (Signed)
 Depression manifest as irritability and being in a bad mood.  She has difficulty getting up except when she has to care for her child.  She is on Prozac  20 mg daily.  Will add Wellbutrin  SR 150 as it is activating as well.  Follow-up in a month.

## 2024-01-02 NOTE — Progress Notes (Signed)
 New Patient Office Visit  Subjective    Patient ID: Robyn Gonzalez, female    DOB: 01-May-2001  Age: 23 y.o. MRN: [id number]  CC:  Chief Complaint  Patient presents with   Establish Care   Anxiety   Depression    HPI Robyn Gonzalez presents to establish care  Discussed the use of AI scribe software for clinical note transcription with the patient, who gave verbal consent to proceed.  History of Present Illness   Robyn Gonzalez is a 23 year old female with PTSD, depression, borderline personality disorder, and anxiety who presents with concerns about her current medication regimen.  She is currently taking fluoxetine  20 mg but finds it ineffective, feeling the same with or without it. Her primary concerns are anxiety and depression, which have worsened since the birth of her son six months ago. She experiences constant worry, difficulty sleeping, and overeating. She struggles to get out of bed unless it involves caring for her child.  She mentions increased irritability and mood issues, stating she is very easy to get irritated and irritable. She does not cry often and tends to keep emotions internalized.  She has a history of difficulty adhering to medication regimens, expressing a mental block against taking pills. She previously attended therapy but had to stop due to scheduling conflicts with school and childcare responsibilities. 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  She reports significant alcohol use, consuming up to 18 beers in one night when socializing, though typically drinks less when alone. She acknowledges a high tolerance and has experienced withdrawal symptoms when attempting to stop. She lives with her parents, who assist with childcare, allowing her to drink while her child is asleep. She has managed to abstain from alcohol for up to two weeks in the past, notably during her pregnancy.  No drug use. She vapes  and wants to quit smoking.   No surgeries and denies any other medical problems aside from those discussed.       Outpatient Encounter Medications as of 01/02/2024  Medication Sig   buPROPion  (WELLBUTRIN  SR) 150 MG 12 hr tablet Take 1 tablet (150 mg total) by mouth 2 (two) times daily.   etonogestrel (NEXPLANON) 68 MG IMPL implant Inject 68 mg into the skin.   fexofenadine  (ALLEGRA ) 180 MG tablet Take 1 tablet (180 mg total) by mouth daily.   fluticasone  (FLONASE ) 50 MCG/ACT nasal spray Place 2 sprays into both nostrils daily.   naltrexone  (DEPADE) 50 MG tablet One half tab daily for a month then one half tab twice a day   oxymetazoline  (AFRIN NASAL SPRAY) 0.05 % nasal spray Place 1 spray into both nostrils 2 (two) times daily. NOT MORE THAN 5 DAYS IN A ROW   [DISCONTINUED] FLUoxetine  (PROZAC ) 20 MG tablet Take 20 mg by mouth daily.   FLUoxetine  (PROZAC ) 20 MG tablet Take 1 tablet (20 mg total) by mouth daily.   [DISCONTINUED] amoxicillin -clavulanate (AUGMENTIN ) 875-125 MG tablet Take 1 tablet by mouth 2 (two) times daily.   [DISCONTINUED] loratadine  (CLARITIN ) 10 MG tablet Take 1 tablet (10 mg total) by mouth daily.   [DISCONTINUED] predniSONE  (DELTASONE ) 10 MG tablet 6 tablets on Day 1 , then reduce by 1 tablet daily until gone   [DISCONTINUED] sertraline  (ZOLOFT ) 50 MG tablet Take 1 tablet (50 mg total) by mouth daily.   No facility-administered encounter medications on file as of 01/02/2024.    Past Medical History:  Diagnosis  Date   Depression 2011   Migraines 2011    History reviewed. No pertinent surgical history.  Family History  Problem Relation Age of Onset   Diabetes type II Father    Diabetes Mellitus II Maternal Grandmother    Hypertension Paternal Grandmother    Hypertension Paternal Grandfather     Social History   Socioeconomic History   Marital status: Single    Spouse name: Not on file   Number of children: Not on file   Years of education: Not on file    Highest education level: Not on file  Occupational History   Not on file  Tobacco Use   Smoking status: Former    Types: E-cigarettes    Start date: 01/18/2015    Quit date: 2022    Years since quitting: 3.5    Passive exposure: Past   Smokeless tobacco: Never   Tobacco comments:    Patient reports no longer smoking  Vaping Use   Vaping status: Former   Substances: Nicotine, CBD, Flavoring  Substance and Sexual Activity   Alcohol use: Not Currently    Comment: See psychiatry note from 05/08/2023   Drug use: Not Currently    Comment: See psychiatry note from 05/08/2023   Sexual activity: Yes    Partners: Male  Other Topics Concern   Not on file  Social History Narrative   Not on file   Social Drivers of Health   Financial Resource Strain: Not on file  Food Insecurity: No Food Insecurity (06/30/2023)   Hunger Vital Sign    Worried About Running Out of Food in the Last Year: Never true    Ran Out of Food in the Last Year: Never true  Transportation Needs: No Transportation Needs (06/30/2023)   PRAPARE - Administrator, Civil Service (Medical): No    Lack of Transportation (Non-Medical): No  Physical Activity: Not on file  Stress: Not on file  Social Connections: Not on file  Intimate Partner Violence: Not At Risk (06/30/2023)   Humiliation, Afraid, Rape, and Kick questionnaire    Fear of Current or Ex-Partner: No    Emotionally Abused: No    Physically Abused: No    Sexually Abused: No    ROS      Objective   BP 120/79 (BP Location: Left Arm, Patient Position: Sitting, Cuff Size: Normal)   Pulse 84   Temp 98.7 F (37.1 C) (Oral)   Resp 20   Ht 5' 4 (1.626 m)   Wt 180 lb (81.6 kg)   LMP  (LMP Unknown)   SpO2 98%   Breastfeeding No   BMI 30.90 kg/m    Physical Exam Vitals and nursing note reviewed.  Constitutional:      Appearance: Normal appearance.  HENT:     Head: Normocephalic and atraumatic.  Eyes:     Conjunctiva/sclera:  Conjunctivae normal.  Cardiovascular:     Rate and Rhythm: Normal rate and regular rhythm.  Pulmonary:     Effort: Pulmonary effort is normal.     Breath sounds: Normal breath sounds.  Musculoskeletal:     Right lower leg: No edema.     Left lower leg: No edema.  Skin:    General: Skin is warm and dry.  Neurological:     Mental Status: She is alert and oriented to person, place, and time.  Psychiatric:        Mood and Affect: Mood normal.  Behavior: Behavior normal.        Thought Content: Thought content normal.        Judgment: Judgment normal.            The ASCVD Risk score (Arnett DK, et al., 2019) failed to calculate for the following reasons:   The 2019 ASCVD risk score is only valid for ages 49 to 69     Assessment & Plan:  Depression, recurrent (HCC) -     FLUoxetine  HCl; Take 1 tablet (20 mg total) by mouth daily.  Dispense: 90 tablet; Refill: 0 -     buPROPion  HCl ER (SR); Take 1 tablet (150 mg total) by mouth 2 (two) times daily.  Dispense: 60 tablet; Refill: 1  Alcohol use disorder Assessment & Plan: Discussed starting naltrexone  to help with craving of alcohol.  Follow-up in a month.  Orders: -     Naltrexone  HCl; One half tab daily for a month then one half tab twice a day  Dispense: 60 tablet; Refill: 3  Major depressive disorder, recurrent episode, moderate (HCC) Assessment & Plan: Depression manifest as irritability and being in a bad mood.  She has difficulty getting up except when she has to care for her child.  She is on Prozac  20 mg daily.  Will add Wellbutrin  SR 150 as it is activating as well.  Follow-up in a month.   Assessment and Plan    Major Depressive Disorder Chronic depression with symptoms of overeating, lack of motivation, and difficulty getting out of bed. Current treatment with fluoxetine  20 mg is insufficient. She reports feeling the same with or without it. Wellbutrin  is considered a better option than increasing  fluoxetine  due to its potential to increase energy and motivation without increasing appetite. - Continue fluoxetine  20 mg - Initiate Wellbutrin  to address energy levels and motivation  Generalized Anxiety Disorder Chronic anxiety exacerbated by recent childbirth. Current treatment with fluoxetine  20 mg is insufficient. She reports constant worry, especially after having a child. Wellbutrin  is initiated to address anxiety symptoms and provide additional energy. - Continue fluoxetine  20 mg - Initiate Wellbutrin  to address anxiety symptoms  Post-Traumatic Stress Disorder (PTSD) No current therapy due to scheduling conflicts. - Encourage resumption of therapy when possible  Borderline Personality Disorder (BPD) BPD with difficulty in mood regulation and irritability. No current therapy due to scheduling conflicts. - Encourage resumption of therapy when possible  Alcohol Use Disorder Regular alcohol consumption with recent intake of 18 beers in one night. Reports daily drinking with attempts to abstain leading to withdrawal symptoms. Lives with parents who assist with childcare. Naltrexone  is initiated to reduce alcohol cravings. - Initiate naltrexone  to reduce alcohol cravings - Advise on the importance of disclosing naltrexone  use to other providers  Nicotine Use Disorder Current vaping habit with a desire to quit smoking. Wellbutrin  may assist in reducing nicotine cravings. - Initiate Wellbutrin , which may assist in reducing nicotine cravings  Follow-up Follow-up needed to assess response to new medication regimen and overall mental health status. - Schedule follow-up appointment in one month to evaluate progress        Return in about 4 weeks (around 01/30/2024).   Eragon Hammond K Dianely Krehbiel, MD

## 2024-01-02 NOTE — Assessment & Plan Note (Signed)
 Discussed starting naltrexone  to help with craving of alcohol.  Follow-up in a month.

## 2024-01-23 DIAGNOSIS — Z419 Encounter for procedure for purposes other than remedying health state, unspecified: Secondary | ICD-10-CM | POA: Diagnosis not present

## 2024-02-02 ENCOUNTER — Encounter: Payer: Self-pay | Admitting: Family Medicine

## 2024-02-02 ENCOUNTER — Ambulatory Visit (INDEPENDENT_AMBULATORY_CARE_PROVIDER_SITE_OTHER): Admitting: Family Medicine

## 2024-02-02 VITALS — BP 101/69 | HR 89 | Resp 16 | Ht 64.0 in | Wt 177.0 lb

## 2024-02-02 DIAGNOSIS — F603 Borderline personality disorder: Secondary | ICD-10-CM

## 2024-02-02 DIAGNOSIS — F331 Major depressive disorder, recurrent, moderate: Secondary | ICD-10-CM

## 2024-02-02 DIAGNOSIS — F411 Generalized anxiety disorder: Secondary | ICD-10-CM | POA: Diagnosis not present

## 2024-02-02 DIAGNOSIS — F410 Panic disorder [episodic paroxysmal anxiety] without agoraphobia: Secondary | ICD-10-CM

## 2024-02-02 DIAGNOSIS — F4310 Post-traumatic stress disorder, unspecified: Secondary | ICD-10-CM | POA: Diagnosis not present

## 2024-02-02 DIAGNOSIS — F1091 Alcohol use, unspecified, in remission: Secondary | ICD-10-CM

## 2024-02-02 LAB — PAP, THIN PREP W/HPV RFLX HPV TYPE 16/18

## 2024-02-02 NOTE — Progress Notes (Signed)
 Established Patient Office Visit  Subjective  Patient ID: Robyn Gonzalez, female    DOB: 2001/03/15  Age: 23 y.o. MRN: [id number]  Chief Complaint  Patient presents with   Depression    Stopped drinking cold [place]   Discussed the use of AI scribe software for clinical note transcription with the patient, who gave verbal consent to proceed.  History of Present Illness   Robyn Gonzalez is a 23 year old female with anxiety and depression who presents for medication management and follow-up.  She is currently taking Prozac  20 mg daily and Wellbutrin  150 mg twice daily, which have been effective in managing her anxiety and depression. Her anxiety is generally well-controlled, though she occasionally experiences anxiety attacks. She manages these episodes by talking with her mother, which helps her to calm down.  She has a seven-month-old child, which contributes to her anxiety. Her sleep is disrupted as her child is not consistently sleeping through the night, especially after teething. She notes that her anxiety is different now compared to before having her child.  She has a history of alcohol use, which she has significantly reduced since her last appointment, opting to quit 'cold [place]'. She mentions a family history of alcohol issues and acknowledges the stress of managing her responsibilities largely on her own, with some support from her mother.  She has a history of borderline personality disorder and PTSD, for which she was previously in therapy, specifically DBT. She had to discontinue therapy due to scheduling conflicts with her school program. She wants to return to therapy when feasible.  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 She finds working with animals to be her passion.         02/02/2024    9:41 AM 01/02/2024    1:52 PM 10/19/2023   11:43 AM 06/21/2022    1:22 PM  GAD 7 :  Generalized Anxiety Score  Nervous, Anxious, on Edge 1 2 2 1   Control/stop worrying 1 2 2 2   Worry too much - different things 1 2 2 2   Trouble relaxing 1 1 2 1   Restless 1 1 1 1   Easily annoyed or irritable 0 2 1 2   Afraid - awful might happen 1 1 1 1   Total GAD 7 Score 6 11 11 10   Anxiety Difficulty Somewhat difficult Somewhat difficult Somewhat difficult Somewhat difficult      02/02/2024    9:40 AM 01/02/2024    1:52 PM 10/19/2023   11:42 AM  PHQ9 SCORE ONLY  PHQ-9 Total Score 9 13  9       Data saved with a previous flowsheet row definition   ROS: see HPI    Objective:     BP 101/69   Pulse 89   Resp 16   Ht 5' 4 (1.626 m)   Wt 177 lb (80.3 kg)   SpO2 99%   Breastfeeding No   BMI 30.38 kg/m  BP Readings from Last 3 Encounters:  02/02/24 101/69  01/02/24 120/79  10/19/23 122/72     Physical Exam Vitals reviewed.  Constitutional:      Appearance: Normal appearance.  Cardiovascular:     Rate and Rhythm: Normal rate and regular rhythm.     Pulses: Normal pulses.     Heart sounds: Normal heart sounds.  Pulmonary:     Effort: Pulmonary effort is normal.     Breath sounds: Normal breath  sounds.  Neurological:     Mental Status: She is alert.  Psychiatric:        Mood and Affect: Mood normal.        Behavior: Behavior normal.     Assessment & Plan:   1. Major depressive disorder, recurrent episode, moderate (HCC) (Primary) Depression improving with current medication. Depression score decreased from 13 to 9. Prefers to maintain current dosages. - Continue Prozac  20 mg daily. - Continue Wellbutrin  150 mg twice daily.  2. Generalized anxiety disorder with panic attacks Anxiety well-controlled with current medication. Anxiety score decreased from 11 to 6. Occasional attacks managed by talking with her mother.  - Continue current medication regimen. - Encourage use of support system for anxiety attacks.  3. Borderline personality disorder (HCC) Previously  engaged in therapy, including DBT. Interested in resuming therapy when feasible. - Consider resuming therapy, including DBT, when schedule allows.  4. PTSD (post-traumatic stress disorder) Previously focused on managing triggers and obsessions. Interested in resuming therapy when feasible. - Consider resuming therapy when schedule allows.  5. Alcohol use disorder in remission Patient reports that she has experienced periods of increase in alcohol intake and reports that she has since decreased her consumption significantly. She does report a family history of alcohol abuse and that she was using this as a negative coping mechanism. Strong family support. - Discontinue naltrexone  prescription.     Return in about 3 months (around 05/04/2024) for Mood f/u.    Evalene Arts, FNP

## 2024-02-23 DIAGNOSIS — Z419 Encounter for procedure for purposes other than remedying health state, unspecified: Secondary | ICD-10-CM | POA: Diagnosis not present

## 2024-02-29 ENCOUNTER — Other Ambulatory Visit: Payer: Self-pay | Admitting: Family Medicine

## 2024-02-29 DIAGNOSIS — F339 Major depressive disorder, recurrent, unspecified: Secondary | ICD-10-CM

## 2024-03-08 ENCOUNTER — Other Ambulatory Visit: Payer: Self-pay

## 2024-03-08 DIAGNOSIS — F339 Major depressive disorder, recurrent, unspecified: Secondary | ICD-10-CM

## 2024-03-08 MED ORDER — BUPROPION HCL ER (SR) 150 MG PO TB12: 150.0000 mg | ORAL_TABLET | Freq: Two times a day (BID) | ORAL | 1 refills | Status: AC

## 2024-03-24 DIAGNOSIS — Z419 Encounter for procedure for purposes other than remedying health state, unspecified: Secondary | ICD-10-CM | POA: Diagnosis not present

## 2024-05-03 ENCOUNTER — Ambulatory Visit: Admitting: Family Medicine

## 2024-05-06 ENCOUNTER — Ambulatory Visit: Admitting: Family Medicine

## 2024-05-07 ENCOUNTER — Ambulatory Visit: Admitting: Family Medicine

## 2024-05-24 DIAGNOSIS — Z419 Encounter for procedure for purposes other than remedying health state, unspecified: Secondary | ICD-10-CM | POA: Diagnosis not present
